# Patient Record
Sex: Male | Born: 1985 | Race: White | Hispanic: No | Marital: Single | Smoking: Current every day smoker
Health system: Southern US, Community
[De-identification: ages and names within clinical notes are randomized; demographics above are authoritative.]

## PROBLEM LIST (undated history)

## (undated) HISTORY — PX: HERNIA REPAIR: SHX51

---
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
## 2003-06-19 ENCOUNTER — Emergency Department (HOSPITAL_COMMUNITY): Admission: AD | Admit: 2003-06-19 | Discharge: 2003-06-19 | Payer: Self-pay | Admitting: Family Medicine

## 2003-08-27 ENCOUNTER — Emergency Department (HOSPITAL_COMMUNITY): Admission: AD | Admit: 2003-08-27 | Discharge: 2003-08-27 | Payer: Self-pay | Admitting: Family Medicine

## 2005-05-23 ENCOUNTER — Emergency Department (HOSPITAL_COMMUNITY): Admission: EM | Admit: 2005-05-23 | Discharge: 2005-05-23 | Payer: Self-pay | Admitting: Family Medicine

## 2007-01-02 ENCOUNTER — Emergency Department (HOSPITAL_COMMUNITY): Admission: EM | Admit: 2007-01-02 | Discharge: 2007-01-02 | Payer: Self-pay | Admitting: Emergency Medicine

## 2007-02-27 ENCOUNTER — Emergency Department (HOSPITAL_COMMUNITY): Admission: EM | Admit: 2007-02-27 | Discharge: 2007-02-27 | Payer: Self-pay | Admitting: Emergency Medicine

## 2007-11-13 ENCOUNTER — Emergency Department (HOSPITAL_COMMUNITY): Admission: EM | Admit: 2007-11-13 | Discharge: 2007-11-13 | Payer: Self-pay | Admitting: Emergency Medicine

## 2007-11-22 ENCOUNTER — Emergency Department (HOSPITAL_COMMUNITY): Admission: EM | Admit: 2007-11-22 | Discharge: 2007-11-22 | Payer: Self-pay | Admitting: Family Medicine

## 2011-06-15 ENCOUNTER — Emergency Department: Payer: Self-pay | Admitting: Emergency Medicine

## 2011-08-14 ENCOUNTER — Emergency Department (INDEPENDENT_AMBULATORY_CARE_PROVIDER_SITE_OTHER)
Admission: EM | Admit: 2011-08-14 | Discharge: 2011-08-14 | Disposition: A | Discharge status: Home / Self Care | Payer: Self-pay | Source: Home / Self Care | Attending: Family Medicine | Admitting: Family Medicine

## 2011-08-14 DIAGNOSIS — K047 Periapical abscess without sinus: Secondary | ICD-10-CM

## 2011-08-14 MED ORDER — AMOXICILLIN 500 MG PO CAPS: 500.0000 mg | ORAL_CAPSULE | Freq: Three times a day (TID) | ORAL | Status: AC

## 2011-08-14 MED ORDER — HYDROCODONE-ACETAMINOPHEN 5-325 MG PO TABS
2.0000 | ORAL_TABLET | Freq: Once | ORAL | Status: AC
  Administered 2011-08-14: 2 via ORAL

## 2011-08-14 MED ORDER — HYDROCODONE-ACETAMINOPHEN 5-325 MG PO TABS: ORAL_TABLET | ORAL | Status: DC

## 2011-08-14 MED ORDER — HYDROCODONE-ACETAMINOPHEN 5-325 MG PO TABS
ORAL_TABLET | ORAL | Status: AC
  Filled 2011-08-14: qty 2

## 2011-08-14 NOTE — ED Notes (Signed)
C/o dental pain and facial swelling for 2 days.  States the affected tooth is on the rt lower and has bothered him intermittently for 1 year but never this bad.

## 2011-08-14 NOTE — ED Provider Notes (Signed)
History     CSN: [id number] Arrival date & time: 08/14/2011  9:21 AM   First MD Initiated Contact with Patient 08/14/11 1032      Chief Complaint  Patient presents with  . Dental Pain    (Consider location/radiation/quality/duration/timing/severity/associated sxs/prior treatment) Patient is a 25 y.o. male presenting with tooth pain. The history is provided by the patient.  Dental PainThe primary symptoms include mouth pain. The symptoms began yesterday. The symptoms are worsening.  Additional symptoms include: gum tenderness, purulent gums, jaw pain and drooling. Associated symptoms comments: No fever.    History reviewed. No pertinent past medical history.  History reviewed. No pertinent past surgical history.  History reviewed. No pertinent family history.  History  Substance Use Topics  . Smoking status: Current Everyday Smoker -- 0.5 packs/day  . Smokeless tobacco: Not on file  . Alcohol Use: Yes      Review of Systems  Constitutional: Negative.   HENT: Positive for drooling.   Respiratory: Negative.   Cardiovascular: Negative.   Gastrointestinal: Negative.   Genitourinary: Negative.   Skin: Negative.     Allergies  Review of patient's allergies indicates no known allergies.  Home Medications   Current Outpatient Rx  Name Route Sig Dispense Refill  . AMOXICILLIN 500 MG PO CAPS Oral Take 1 capsule (500 mg total) by mouth 3 (three) times daily. 30 capsule 0  . HYDROCODONE-ACETAMINOPHEN 5-325 MG PO TABS  1-2 tabs q 6 hrs prn pain 15 tablet 0    BP 143/94  Pulse 54  Temp(Src) 98.7 F (37.1 C) (Oral)  Resp 16  SpO2 100%  Physical Exam  Nursing note and vitals reviewed. Constitutional: He appears well-developed and well-nourished.  HENT:       Right facial swelling. Over the mandible  Cardiovascular: Normal rate and regular rhythm.   Pulmonary/Chest: Effort normal and breath sounds normal.    ED Course  Procedures (including critical care  time)  Labs Reviewed - No data to display No results found.   1. Dental abscess       MDM          Randa Spike, MD 08/14/11 [phone]

## 2011-08-17 ENCOUNTER — Encounter (HOSPITAL_COMMUNITY): Payer: Self-pay | Admitting: Emergency Medicine

## 2011-08-17 ENCOUNTER — Emergency Department (HOSPITAL_COMMUNITY)
Admission: EM | Admit: 2011-08-17 | Discharge: 2011-08-17 | Disposition: A | Discharge status: Home / Self Care | Payer: Self-pay | Attending: Emergency Medicine | Admitting: Emergency Medicine

## 2011-08-17 DIAGNOSIS — R220 Localized swelling, mass and lump, head: Secondary | ICD-10-CM | POA: Insufficient documentation

## 2011-08-17 DIAGNOSIS — K0889 Other specified disorders of teeth and supporting structures: Secondary | ICD-10-CM

## 2011-08-17 DIAGNOSIS — K089 Disorder of teeth and supporting structures, unspecified: Secondary | ICD-10-CM | POA: Insufficient documentation

## 2011-08-17 DIAGNOSIS — K047 Periapical abscess without sinus: Secondary | ICD-10-CM | POA: Insufficient documentation

## 2011-08-17 DIAGNOSIS — R221 Localized swelling, mass and lump, neck: Secondary | ICD-10-CM | POA: Insufficient documentation

## 2011-08-17 DIAGNOSIS — K029 Dental caries, unspecified: Secondary | ICD-10-CM | POA: Insufficient documentation

## 2011-08-17 DIAGNOSIS — F17200 Nicotine dependence, unspecified, uncomplicated: Secondary | ICD-10-CM | POA: Insufficient documentation

## 2011-08-17 MED ORDER — IBUPROFEN 600 MG PO TABS: 600.0000 mg | ORAL_TABLET | Freq: Four times a day (QID) | ORAL | Status: AC | PRN

## 2011-08-17 MED ORDER — HYDROCODONE-ACETAMINOPHEN 5-325 MG PO TABS
2.0000 | ORAL_TABLET | Freq: Once | ORAL | Status: AC
  Administered 2011-08-17: 2 via ORAL
  Filled 2011-08-17: qty 2

## 2011-08-17 MED ORDER — HYDROCODONE-ACETAMINOPHEN 5-500 MG PO TABS: 1.0000 | ORAL_TABLET | Freq: Four times a day (QID) | ORAL | Status: AC | PRN

## 2011-08-17 NOTE — ED Notes (Signed)
Pt states he was seen at [facility] a couple days ago for toothache.  States he is taking antibiotic and is out of pain medication.  States he is unable to afford dentist appt and pain is worse.  States swelling has not improved.

## 2011-08-17 NOTE — ED Provider Notes (Signed)
History     CSN: [id number] Arrival date & time: 08/17/2011  3:57 PM   First MD Initiated Contact with Patient 08/17/11 1651      Chief Complaint  Patient presents with  . Dental Pain    (Consider location/radiation/quality/duration/timing/severity/associated sxs/prior treatment) Patient is a 25 y.o. male presenting with tooth pain. The history is provided by the patient.  Dental PainPrimary symptoms do not include fever, shortness of breath or sore throat.  pt c/o right lower dental pain for past week. Constant, dull, worse w eating. No radiation. No neck pain. No headache. No fever or chills. Has no local dentist. Is on abx but is out of pain med.   History reviewed. No pertinent past medical history.  History reviewed. No pertinent past surgical history.  No family history on file.  History  Substance Use Topics  . Smoking status: Current Everyday Smoker -- 0.5 packs/day  . Smokeless tobacco: Not on file  . Alcohol Use: Yes      Review of Systems  Constitutional: Negative for fever and chills.  HENT: Negative for sore throat.   Respiratory: Negative for shortness of breath.     Allergies  Review of patient's allergies indicates no known allergies.  Home Medications   Current Outpatient Rx  Name Route Sig Dispense Refill  . AMOXICILLIN 500 MG PO CAPS Oral Take 1 capsule (500 mg total) by mouth 3 (three) times daily. 30 capsule 0  . HYDROCODONE-ACETAMINOPHEN 5-325 MG PO TABS Oral Take 1-2 tablets by mouth every 6 (six) hours as needed. For pain       BP 125/76  Pulse 93  Temp(Src) 98.1 F (36.7 C) (Oral)  Resp 18  SpO2 98%  Physical Exam  Nursing note and vitals reviewed. Constitutional: He is oriented to person, place, and time. He appears well-developed and well-nourished. No distress.  HENT:  Head: Atraumatic.  Mouth/Throat: Oropharynx is clear and moist.       Multiple dental caries, absent teeth. Right lower dental decay, assoc gum swelling and  tenderness. No trismus. No swelling/tenderness to floor of mouth or neck.   Eyes: Pupils are equal, round, and reactive to light.  Neck: Neck supple. No tracheal deviation present.  Cardiovascular: Normal rate.   Pulmonary/Chest: Effort normal. No accessory muscle usage. No respiratory distress.  Musculoskeletal: Normal range of motion.  Lymphadenopathy:    He has no cervical adenopathy.  Neurological: He is alert and oriented to person, place, and time.  Skin: Skin is warm and dry.  Psychiatric: He has a normal mood and affect.    ED Course  Procedures (including critical care time)     MDM  Pt has ride, does not have to drive. vicodin po. Discussed need for close dental follow up.         Suzi Roots, MD 08/17/11 [phone]

## 2012-11-25 ENCOUNTER — Encounter (HOSPITAL_COMMUNITY): Payer: Self-pay | Admitting: *Deleted

## 2012-11-25 ENCOUNTER — Emergency Department (HOSPITAL_COMMUNITY)
Admission: EM | Admit: 2012-11-25 | Discharge: 2012-11-25 | Disposition: A | Discharge status: Home / Self Care | Payer: Self-pay | Attending: Emergency Medicine | Admitting: Emergency Medicine

## 2012-11-25 DIAGNOSIS — R1110 Vomiting, unspecified: Secondary | ICD-10-CM

## 2012-11-25 DIAGNOSIS — F17200 Nicotine dependence, unspecified, uncomplicated: Secondary | ICD-10-CM | POA: Insufficient documentation

## 2012-11-25 DIAGNOSIS — R112 Nausea with vomiting, unspecified: Secondary | ICD-10-CM | POA: Insufficient documentation

## 2012-11-25 DIAGNOSIS — R109 Unspecified abdominal pain: Secondary | ICD-10-CM | POA: Insufficient documentation

## 2012-11-25 DIAGNOSIS — Z9889 Other specified postprocedural states: Secondary | ICD-10-CM | POA: Insufficient documentation

## 2012-11-25 LAB — CBC WITH DIFFERENTIAL/PLATELET
Basophils Relative: 0 % (ref 0–1)
Eosinophils Absolute: 0 10*3/uL (ref 0.0–0.7)
Eosinophils Relative: 0 % (ref 0–5)
HCT: 47 % (ref 39.0–52.0)
Hemoglobin: 17.2 g/dL — ABNORMAL HIGH (ref 13.0–17.0)
Lymphs Abs: 2.4 10*3/uL (ref 0.7–4.0)
MCH: 33.3 pg (ref 26.0–34.0)
MCHC: 36.6 g/dL — ABNORMAL HIGH (ref 30.0–36.0)
MCV: 91.1 fL (ref 78.0–100.0)
Monocytes Absolute: 0.9 10*3/uL (ref 0.1–1.0)
Monocytes Relative: 5 % (ref 3–12)
Neutrophils Relative %: 80 % — ABNORMAL HIGH (ref 43–77)
RBC: 5.16 MIL/uL (ref 4.22–5.81)

## 2012-11-25 LAB — COMPREHENSIVE METABOLIC PANEL
Alkaline Phosphatase: 64 U/L (ref 39–117)
BUN: 14 mg/dL (ref 6–23)
Creatinine, Ser: 0.84 mg/dL (ref 0.50–1.35)
GFR calc Af Amer: >90 mL/min (ref >90)
Glucose, Bld: 98 mg/dL (ref 70–99)
Potassium: 3.7 mEq/L (ref 3.5–5.1)
Total Protein: 8 g/dL (ref 6.0–8.3)

## 2012-11-25 MED ORDER — ONDANSETRON HCL 4 MG/2ML IJ SOLN
4.0000 mg | Freq: Once | INTRAMUSCULAR | Status: AC
  Administered 2012-11-25: 4 mg via INTRAVENOUS
  Filled 2012-11-25: qty 2

## 2012-11-25 MED ORDER — ONDANSETRON 4 MG PO TBDP
8.0000 mg | ORAL_TABLET | Freq: Once | ORAL | Status: AC
  Administered 2012-11-25: 8 mg via ORAL
  Filled 2012-11-25: qty 2

## 2012-11-25 MED ORDER — GI COCKTAIL ~~LOC~~
30.0000 mL | Freq: Once | ORAL | Status: AC
  Administered 2012-11-25: 30 mL via ORAL
  Filled 2012-11-25: qty 30

## 2012-11-25 MED ORDER — ONDANSETRON HCL 4 MG PO TABS: 4.0000 mg | ORAL_TABLET | Freq: Three times a day (TID) | ORAL | Status: DC | PRN

## 2012-11-25 NOTE — ED Notes (Signed)
Pt with abd cramping, emesis, on and off chills since this am.

## 2012-11-25 NOTE — ED Notes (Signed)
Patient states he woke up this morning with nausea, vomiting and chills. Patient is afebrile. He states he cant even hold water down. No OTC's taken at home.

## 2012-11-25 NOTE — ED Provider Notes (Signed)
History     CSN: [id number]  Arrival date & time 11/25/12  1520   First MD Initiated Contact with Patient 11/25/12 1926      Chief Complaint  Patient presents with  . Abdominal Pain  . Emesis    (Consider location/radiation/quality/duration/timing/severity/associated sxs/prior treatment) Patient is a 27 y.o. male presenting with abdominal pain and vomiting.  Abdominal Pain Associated symptoms: vomiting   Emesis Associated symptoms: abdominal pain    Pt reports onset of vomiting and cramping abdominal pain earlier today. Multiple episode of vomiting, but improved since given ODT zofran in Triage. Pain is improved as well, but reports he is having indigestion now. Denies fever no diarrhea. No blood in vomit. No sick contact.   History reviewed. No pertinent past medical history.  Past Surgical History  Procedure Laterality Date  . Hernia repair      History reviewed. No pertinent family history.  History  Substance Use Topics  . Smoking status: Current Every Day Smoker -- 0.50 packs/day  . Smokeless tobacco: Not on file  . Alcohol Use: 16.8 oz/week    28 Cans of beer per week      Review of Systems  Gastrointestinal: Positive for vomiting and abdominal pain.   All other systems reviewed and are negative except as noted in HPI.   Allergies  Review of patient's allergies indicates no known allergies.  Home Medications  No current outpatient prescriptions on file.  BP 131/93  Pulse 96  Temp(Src) 98.5 F (36.9 C) (Oral)  SpO2 99%  Physical Exam  Nursing note and vitals reviewed. Constitutional: He is oriented to person, place, and time. He appears well-developed and well-nourished.  HENT:  Head: Normocephalic and atraumatic.  Eyes: EOM are normal. Pupils are equal, round, and reactive to light.  Neck: Normal range of motion. Neck supple.  Cardiovascular: Normal rate, normal heart sounds and intact distal pulses.   Pulmonary/Chest: Effort normal and breath  sounds normal.  Abdominal: Bowel sounds are normal. He exhibits no distension. There is no tenderness.  Musculoskeletal: Normal range of motion. He exhibits no edema and no tenderness.  Neurological: He is alert and oriented to person, place, and time. He has normal strength. No cranial nerve deficit or sensory deficit.  Skin: Skin is warm and dry. No rash noted.  Psychiatric: He has a normal mood and affect.    ED Course  Procedures (including critical care time)  Labs Reviewed  CBC WITH DIFFERENTIAL - Abnormal; Notable for the following:    WBC 16.7 (*)    Hemoglobin 17.2 (*)    MCHC 36.6 (*)    Neutrophils Relative 80 (*)    Neutro Abs 13.4 (*)    All other components within normal limits  COMPREHENSIVE METABOLIC PANEL   No results found.   1. Vomiting   DM  Labs unremarkable. Feeling better, tolerating PO fluids. Given GI cocktail for indigestion with improvement.         Charles B. Bernette Mayers, MD 11/25/12 2014

## 2013-04-14 ENCOUNTER — Encounter (HOSPITAL_COMMUNITY): Payer: Self-pay | Admitting: Emergency Medicine

## 2013-04-14 ENCOUNTER — Emergency Department (HOSPITAL_COMMUNITY)
Admission: EM | Admit: 2013-04-14 | Discharge: 2013-04-14 | Disposition: A | Discharge status: Home / Self Care | Payer: Self-pay | Attending: Emergency Medicine | Admitting: Emergency Medicine

## 2013-04-14 DIAGNOSIS — L0291 Cutaneous abscess, unspecified: Secondary | ICD-10-CM

## 2013-04-14 DIAGNOSIS — F17200 Nicotine dependence, unspecified, uncomplicated: Secondary | ICD-10-CM | POA: Insufficient documentation

## 2013-04-14 DIAGNOSIS — L0231 Cutaneous abscess of buttock: Secondary | ICD-10-CM | POA: Insufficient documentation

## 2013-04-14 MED ORDER — CEPHALEXIN 500 MG PO CAPS: 500.0000 mg | ORAL_CAPSULE | Freq: Four times a day (QID) | ORAL | Status: DC

## 2013-04-14 MED ORDER — SULFAMETHOXAZOLE-TRIMETHOPRIM 800-160 MG PO TABS: 2.0000 | ORAL_TABLET | Freq: Two times a day (BID) | ORAL | Status: DC

## 2013-04-14 NOTE — ED Notes (Signed)
Had a bump onhis butt and he popped it and now ir is bigger and redder x 4 days

## 2013-04-14 NOTE — ED Provider Notes (Signed)
  CSN: [id number]     Arrival date & time 04/14/13  1253 History     First MD Initiated Contact with Patient 04/14/13 1335     Chief Complaint  Patient presents with  . Recurrent Skin Infections   (Consider location/radiation/quality/duration/timing/severity/associated sxs/prior Treatment) HPI Comments: Patient presents with a chief complaint of an abscess to the buttock.  He reports that the abscess has been present for the past week.  He attempted to pop the area himself yesterday.  He was able to express a small amount of purulent fluid, but reports that he then developed erythema and increased pain surrounding the area after this.  He denies fever or chills.  No nausea or vomiting.  Denies prior history of DM or abscess in the past.  No history of HIV.    The history is provided by the patient.    History reviewed. No pertinent past medical history. Past Surgical History  Procedure Laterality Date  . Hernia repair     No family history on file. History  Substance Use Topics  . Smoking status: Current Every Day Smoker -- 0.50 packs/day  . Smokeless tobacco: Not on file  . Alcohol Use: 16.8 oz/week    28 Cans of beer per week    Review of Systems  Skin:       abscess    Allergies  Review of patient's allergies indicates no known allergies.  Home Medications  No current outpatient prescriptions on file. BP 118/67  Pulse 101  Temp(Src) 97.9 F (36.6 C) (Oral)  Resp 18  Ht 5\' 11"  (1.803 m)  Wt 170 lb (77.111 kg)  BMI 23.72 kg/m2  SpO2 97% Physical Exam  Nursing note and vitals reviewed. Constitutional: He appears well-developed and well-nourished.  HENT:  Head: Normocephalic and atraumatic.  Cardiovascular: Normal rate, regular rhythm and normal heart sounds.   Pulmonary/Chest: Effort normal and breath sounds normal.  Neurological: He is alert.  Skin: Skin is warm and dry.  Approximately 2-3 cm abscess of the right buttock with surrounding erythema, warmth, and  induration.  Psychiatric: He has a normal mood and affect.    ED Course   Procedures (including critical care time)  Labs Reviewed - No data to display No results found. No diagnosis found.  INCISION AND DRAINAGE Performed by: Anne Shutter, Ruthellen Tippy Consent: Verbal consent obtained. Risks and benefits: risks, benefits and alternatives were discussed Type: abscess  Body area: right buttock  Anesthesia: local infiltration  Incision was made with a scalpel.  Local anesthetic: lidocaine 2% with epinephrine  Anesthetic total: 6 ml  Complexity: complex Blunt dissection to break up loculations  Drainage: purulent  Drainage amount: small  Patient tolerance: Patient tolerated the procedure well with no immediate complications.     MDM  Patient with skin abscess.  Abscess incised and drained, small amount of purulent fluid expressed.   Abscess was not large enough to warrant packing or drain,  wound recheck in 2 days. Encouraged home warm soaks.  Some cellulitis of surrounding skin.    Will d/c to home with antibiotic.  Patient stable for discharge.  Return precautions given.  Pascal Lux Granjeno, PA-C 04/15/13 [phone]

## 2013-04-20 NOTE — ED Provider Notes (Signed)
Medical screening examination/treatment/procedure(s) were performed by non-physician practitioner and as supervising physician I was immediately available for consultation/collaboration.   Charles B. Sheldon, MD 04/20/13 1113 

## 2013-05-19 ENCOUNTER — Emergency Department (HOSPITAL_COMMUNITY)
Admission: EM | Admit: 2013-05-19 | Discharge: 2013-05-19 | Disposition: A | Discharge status: Home / Self Care | Payer: Self-pay | Attending: Emergency Medicine | Admitting: Emergency Medicine

## 2013-05-19 ENCOUNTER — Encounter (HOSPITAL_COMMUNITY): Payer: Self-pay | Admitting: *Deleted

## 2013-05-19 DIAGNOSIS — J029 Acute pharyngitis, unspecified: Secondary | ICD-10-CM | POA: Insufficient documentation

## 2013-05-19 DIAGNOSIS — R05 Cough: Secondary | ICD-10-CM | POA: Insufficient documentation

## 2013-05-19 DIAGNOSIS — J069 Acute upper respiratory infection, unspecified: Secondary | ICD-10-CM | POA: Insufficient documentation

## 2013-05-19 DIAGNOSIS — L408 Other psoriasis: Secondary | ICD-10-CM | POA: Insufficient documentation

## 2013-05-19 DIAGNOSIS — J3489 Other specified disorders of nose and nasal sinuses: Secondary | ICD-10-CM | POA: Insufficient documentation

## 2013-05-19 DIAGNOSIS — R059 Cough, unspecified: Secondary | ICD-10-CM | POA: Insufficient documentation

## 2013-05-19 DIAGNOSIS — R6883 Chills (without fever): Secondary | ICD-10-CM | POA: Insufficient documentation

## 2013-05-19 DIAGNOSIS — L409 Psoriasis, unspecified: Secondary | ICD-10-CM

## 2013-05-19 LAB — RAPID STREP SCREEN (MED CTR MEBANE ONLY): Streptococcus, Group A Screen (Direct): NEGATIVE

## 2013-05-19 NOTE — ED Provider Notes (Signed)
CSN: [id number]     Arrival date & time 05/19/13  7829 History   First MD Initiated Contact with Patient 05/19/13 0701     Chief Complaint  Patient presents with  . Rash   (Consider location/radiation/quality/duration/timing/severity/associated sxs/prior Treatment) Patient is a 27 y.o. male presenting with rash. The history is provided by the patient.  Rash Location:  Full body Quality: dryness, peeling, redness and scaling   Severity:  Severe Onset quality:  Gradual Duration: psoriasis for years but worse over the last 2 weeks. Timing:  Constant Progression:  Worsening Chronicity:  New Context: sick contacts   Context comment:  Neice recently with strep Relieved by:  Nothing Worsened by:  Nothing tried Ineffective treatments:  OTC analgesics and moisturizers Associated symptoms: sore throat and URI   Associated symptoms: no abdominal pain, no fever, no nausea, no shortness of breath, no throat swelling and no tongue swelling   Associated symptoms comment:  Cough Sore throat:    Severity:  Moderate   Onset quality:  Gradual   Duration:  2 days   Timing:  Constant   Progression:  Worsening   History reviewed. No pertinent past medical history. Past Surgical History  Procedure Laterality Date  . Hernia repair     No family history on file. History  Substance Use Topics  . Smoking status: Current Every Day Smoker -- 0.50 packs/day  . Smokeless tobacco: Not on file  . Alcohol Use: 16.8 oz/week    28 Cans of beer per week    Review of Systems  Constitutional: Positive for chills. Negative for fever.  HENT: Positive for congestion and sore throat. Negative for drooling, mouth sores and sinus pressure.   Respiratory: Positive for cough. Negative for shortness of breath.   Gastrointestinal: Negative for nausea and abdominal pain.  Skin: Positive for rash.  All other systems reviewed and are negative.    Allergies  Review of patient's allergies indicates no known  allergies.  Home Medications   Current Outpatient Rx  Name  Route  Sig  Dispense  Refill  . cephALEXin (KEFLEX) 500 MG capsule   Oral   Take 1 capsule (500 mg total) by mouth 4 (four) times daily.   40 capsule   0   . sulfamethoxazole-trimethoprim (SEPTRA DS) 800-160 MG per tablet   Oral   Take 2 tablets by mouth 2 (two) times daily.   40 tablet   0    BP 132/91  Pulse 95  Temp(Src) 98.4 F (36.9 C) (Oral)  Resp 16  Ht 5\' 11"  (1.803 m)  SpO2 99% Physical Exam  Nursing note and vitals reviewed. Constitutional: He is oriented to person, place, and time. He appears well-developed and well-nourished. No distress.  HENT:  Head: Normocephalic and atraumatic.  Mouth/Throat: Mucous membranes are normal. Posterior oropharyngeal erythema present. No tonsillar abscesses.  Eyes: Conjunctivae and EOM are normal. Pupils are equal, round, and reactive to light.  Neck: Normal range of motion. Neck supple.  Cardiovascular: Normal rate, regular rhythm and intact distal pulses.   No murmur heard. Pulmonary/Chest: Effort normal and breath sounds normal. No respiratory distress. He has no wheezes. He has no rales.  Abdominal: Soft. He exhibits no distension. There is no tenderness. There is no rebound and no guarding.  Musculoskeletal: Normal range of motion. He exhibits no edema and no tenderness.  Lymphadenopathy:    He has no cervical adenopathy.  Neurological: He is alert and oriented to person, place, and time.  Skin: Skin  is warm and dry. No rash noted. No erythema.  Psychiatric: He has a normal mood and affect. His behavior is normal.    ED Course  Procedures (including critical care time) Labs Review Labs Reviewed  RAPID STREP SCREEN   Imaging Review No results found.  MDM  No diagnosis found.  Patient presenting here with concern for strep throat. He had exposure and has had sore throats and fever and also cough. Erythema in the pharynx but no tonsillar exudate. Also  patient has a history of psoriasis which is been ongoing since age 61 but it has worsened over the last 2 weeks which she states in the past when he had strep throat a similar thing happened. No signs of scarlet fever at this time. Patient is nontoxic appearing in no sign of PTA, RPA or concern for epiglottitis.  Feel most likely viral URI vs strep.  7:46 AM Rapid strep neg.  Feel most likely URI.  Will do supportive care.  Gwyneth Sprout, MD 05/19/13 [phone]

## 2013-05-19 NOTE — ED Notes (Signed)
Dr. Plunkett at bedside.  

## 2013-05-19 NOTE — ED Notes (Signed)
The pt was exposed to strep throat 14 days ago and he has  A rash that has been worse for the past week

## 2013-05-21 LAB — CULTURE, GROUP A STREP

## 2013-06-22 ENCOUNTER — Encounter (HOSPITAL_COMMUNITY): Payer: Self-pay | Admitting: Emergency Medicine

## 2013-06-22 ENCOUNTER — Emergency Department (HOSPITAL_COMMUNITY)
Admission: EM | Admit: 2013-06-22 | Discharge: 2013-06-22 | Disposition: A | Discharge status: Home / Self Care | Payer: Self-pay | Attending: Emergency Medicine | Admitting: Emergency Medicine

## 2013-06-22 DIAGNOSIS — F17200 Nicotine dependence, unspecified, uncomplicated: Secondary | ICD-10-CM | POA: Insufficient documentation

## 2013-06-22 DIAGNOSIS — L84 Corns and callosities: Secondary | ICD-10-CM | POA: Insufficient documentation

## 2013-06-22 NOTE — ED Provider Notes (Signed)
CSN: [id number]     Arrival date & time 06/22/13  1478 History   First MD Initiated Contact with Patient 06/22/13 [phone]     Chief Complaint  Patient presents with  . Foot Pain    R foot   (Consider location/radiation/quality/duration/timing/severity/associated sxs/prior Treatment) HPI.... pain on the bottom of right foot for several weeks. Patient is  scraping off a callus as needed. No fever, chills, redness. Pain increases with ambulation. Severity is mild to moderate.  History reviewed. No pertinent past medical history. Past Surgical History  Procedure Laterality Date  . Hernia repair     No family history on file. History  Substance Use Topics  . Smoking status: Current Every Day Smoker -- 0.50 packs/day    Types: Cigarettes  . Smokeless tobacco: Not on file  . Alcohol Use: 16.8 oz/week    28 Cans of beer per week    Review of Systems  All other systems reviewed and are negative.    Allergies  Review of patient's allergies indicates no known allergies.  Home Medications   Current Outpatient Rx  Name  Route  Sig  Dispense  Refill  . Ibuprofen-Diphenhydramine HCl (ADVIL PM) 200-25 MG CAPS   Oral   Take 2 tablets by mouth at bedtime as needed (sleep).          BP 120/76  Pulse 69  Temp(Src) 98.7 F (37.1 C) (Oral)  Resp 18  Ht 5\' 11"  (1.803 m)  Wt 165 lb (74.844 kg)  BMI 23.02 kg/m2  SpO2 98% Physical Exam  Constitutional: He is oriented to person, place, and time. He appears well-developed and well-nourished.  HENT:  Head: Normocephalic and atraumatic.  Musculoskeletal:  Right foot: Callus/corn on plantar aspect at the MTP joint of the second third and fourth digit  Neurological: He is alert and oriented to person, place, and time.  Skin: Skin is warm and dry.  Psychiatric: He has a normal mood and affect.    ED Course  Procedures (including critical care time) Labs Review Labs Reviewed - No data to display Imaging Review No results  found.  EKG Interpretation   None       MDM   1. Corn of foot    Patient will require minor surgery. I attempted to get him appointment with the local podiatry group. Phone number given for podiatrist  [phone]    Donnetta Hutching, MD 06/22/13 1048

## 2013-06-22 NOTE — ED Notes (Signed)
Patient states that he has had a problem with the bottom of his right foot.   Patient states "it used to be a hard place on the bottom that I could peel off".  "I got this egg thing but it made it spread.   If I can get it to the root, it is better".

## 2013-09-14 ENCOUNTER — Encounter (HOSPITAL_COMMUNITY): Payer: Self-pay | Admitting: Emergency Medicine

## 2013-09-14 ENCOUNTER — Emergency Department (HOSPITAL_COMMUNITY)
Admission: EM | Admit: 2013-09-14 | Discharge: 2013-09-14 | Disposition: A | Discharge status: Home / Self Care | Payer: Self-pay | Attending: Emergency Medicine | Admitting: Emergency Medicine

## 2013-09-14 DIAGNOSIS — M25559 Pain in unspecified hip: Secondary | ICD-10-CM | POA: Insufficient documentation

## 2013-09-14 DIAGNOSIS — M549 Dorsalgia, unspecified: Secondary | ICD-10-CM | POA: Insufficient documentation

## 2013-09-14 DIAGNOSIS — H9203 Otalgia, bilateral: Secondary | ICD-10-CM

## 2013-09-14 DIAGNOSIS — M25551 Pain in right hip: Secondary | ICD-10-CM

## 2013-09-14 DIAGNOSIS — G8929 Other chronic pain: Secondary | ICD-10-CM | POA: Insufficient documentation

## 2013-09-14 DIAGNOSIS — F17200 Nicotine dependence, unspecified, uncomplicated: Secondary | ICD-10-CM | POA: Insufficient documentation

## 2013-09-14 DIAGNOSIS — H9209 Otalgia, unspecified ear: Secondary | ICD-10-CM | POA: Insufficient documentation

## 2013-09-14 MED ORDER — NAPROXEN SODIUM 220 MG PO TABS: 220.0000 mg | ORAL_TABLET | Freq: Two times a day (BID) | ORAL | Status: DC

## 2013-09-14 MED ORDER — ACETAMINOPHEN 500 MG PO TABS: 1000.0000 mg | ORAL_TABLET | Freq: Four times a day (QID) | ORAL | Status: DC | PRN

## 2013-09-14 NOTE — Discharge Instructions (Signed)
°Emergency Department Resource Guide °1) Find a Doctor and Pay Out of Pocket °Although you won't have to find out who is covered by your insurance plan, it is a good idea to ask around and get recommendations. You will then need to call the office and see if the doctor you have chosen will accept you as a new patient and what types of options they offer for patients who are self-pay. Some doctors offer discounts or will set up payment plans for their patients who do not have insurance, but you will need to ask so you aren't surprised when you get to your appointment. ° °2) Contact Your Local Health Department °Not all health departments have doctors that can see patients for sick visits, but many do, so it is worth a call to see if yours does. If you don't know where your local health department is, you can check in your phone book. The CDC also has a tool to help you locate your state's health department, and many state websites also have listings of all of their local health departments. ° °3) Find a Walk-in Clinic °If your illness is not likely to be very severe or complicated, you may want to try a walk in clinic. These are popping up all over the country in pharmacies, drugstores, and shopping centers. They're usually staffed by nurse practitioners or physician assistants that have been trained to treat common illnesses and complaints. They're usually fairly quick and inexpensive. However, if you have serious medical issues or chronic medical problems, these are probably not your best option. ° °No Primary Care Doctor: °- Call [facility] at  832-8000 - they can help you locate a primary care doctor that  accepts your insurance, provides certain services, etc. °- Physician Referral Service- 1-800-533-3463 ° °Chronic Pain Problems: °Organization         Address  Phone   Notes  °[facility]  (336) 297-2271 Patients need to be referred by their primary care doctor.  ° °Medication  Assistance: °Organization         Address  Phone   Notes  °[place] [facility] [address] °[place], [place] [address] (336) 641-8030 --Must be a resident of [place] °-- Must have NO insurance coverage whatsoever (no Medicaid/ Medicare, etc.) °-- The pt. MUST have a primary care doctor that directs their care regularly and follows them in the community °  °[facility]  (866) 331-1348   °[employer]  (888) 892-1162   ° °Agencies that provide inexpensive medical care: °Organization         Address  Phone   Notes  °[facility]  (336) 832-8035   °[facility]    (336) 832-7272   °[facility] [address] °[place], [place] [address] (336) 832-4777   °[facility] [address], °[place] (336) 271-4999   °[facility]    (336) 373-0678   °[facility]    (336) 272-1050   °[facility] ° [address], [place] Phone:  (336) 832-4444, Fax:  (336) 832-4440 Hours of Operation:  9 am - 6 pm, M-F.  Also accepts Medicaid/Medicare and self-pay.  °[facility] ° [address], [place] Phone: (336) 832-3150, Fax: (336) 832-3151. Hours of Operation:  8:30 am - 5:30 pm, M-F.  Also accepts Medicaid and self-pay.  °[facility] [place] [address], [place] Phone: (336) 878-6027   °[facility] [address], [place], [place] (336)723-1848, Ext. 123 Mondays & Thursdays: 7-9 AM.  First 15 patients are seen on a first come, first serve basis. °  ° °Medicaid-accepting [place] Providers: ° °Organization         Address  Phone   Notes  °[facility] [address], [place] (336) 641-2100 Also accepts self-pay patients.  °[facility] [address], [place] ° (336) 856-9996   °[facility] [address], [place]  (336) 288-8857   °[facility] [address] [place] [address], [place] (336) 299-7000   °Veita Bland [address], [place]  ° (336) 373-1557 Only accepts [place] Access Medicaid patients after they have their name applied to their card.  ° °Self-Pay (no insurance) in [place]: ° °Organization         Address  Phone   Notes  °Sickle Cell Patients, [facility] [address], [place] (336) 832-1970   °[facility] [address], [place] (336) 832-4400   °[facility] ° [address], [place] (336) 992-4800   °[facility]/Dr. Osei-Bonsu ° [address] [place] [address], [place] or [address], [place] (336) 841-8500 Phone number for both [place] and [place] locations is the same.  °[facility] [address], [place] (336) 299-0000   °[facility] [address] [place] [address], [place] or [address] (336) 852-7530 °(336) 878-2260   °[facility] [address], [place] (336) 350-1642, phone; (336) 294-5005, fax Sees patients 1st and 3rd Saturday of every month.  Must not qualify for public or private insurance (i.e. Medicaid, Medicare, [place] Health Choice, Veterans' Benefits) • Household income should be no more than 200% of the poverty level •The clinic cannot treat you if you are pregnant or think you are pregnant • Sexually transmitted diseases are not treated at the clinic.  ° ° °Dental Care: °Organization         Address  Phone  Notes  °[place] [facility] [address], [place] (336) 641-6152 Accepts children up to age 21 who are enrolled in Medicaid or [place] Health Choice; pregnant women with a Medicaid card; and children who have applied for Medicaid or [place] Health Choice, but were declined, whose parents can pay a reduced fee at time of service.  °[place]  [facility] [place]  [address], [place] (336) 641-7733 Accepts children up to age 21 who are enrolled in Medicaid or [place] Health Choice; pregnant women with a Medicaid card; and children who have applied for Medicaid or [place] Health Choice, but were declined, whose parents can pay a reduced fee at time of service.  °[facility] ° [address], [place] (336) 641-4533 Patients are seen by appointment only. Walk-ins are not accepted. [facility] will see patients 18 years of age and older. °Monday - Tuesday (8am-5pm) °Most Wednesdays (8:30-5pm) °$30 per visit, cash only  °[facility] ° [address], [place] (336) 641-4533 Patients are seen by appointment only. Walk-ins are not accepted. [facility] will see patients 18 years of age and older. °One   Wednesday Evening (Monthly: Volunteer Based).  $30 per visit, cash only  °[facility]  (919) 537-3737 for adults; Children under age 4, call Graduate Pediatric Dentistry at (919) [phone]. Children aged 4-14, please call (919) [phone] to request a pediatric application. ° Dental services are provided in all areas of dental care including fillings, crowns and bridges, complete and partial dentures, implants, gum treatment, root canals, and extractions. Preventive care is also provided. Treatment is provided to both adults and children. °Patients are selected via a lottery and there is often a waiting list. °  °[facility] [address], °[place] ° (336) 763-8833 [email or web address] °  °[facility] [address], [place], [place] (336)723-1848, Ext. 123 Second and Fourth Thursday of each month, opens at 6:30 AM; Clinic ends at 9 AM.  Patients are seen on a first-come first-served basis, and a limited number are seen during each clinic.  ° °[facility] ° [address], [place], [place] (336) 723-7904    Eligibility Requirements °You must have lived in [place], [place], or [place] counties for at least the last three months. °  You cannot be eligible for state or federal sponsored healthcare insurance, including Veterans Administration, Medicaid, or Medicare. °  You generally cannot be eligible for healthcare insurance through your employer.  °  How to apply: °Eligibility screenings are held every Tuesday and Wednesday afternoon from 1:00 pm until 4:00 pm. You do not need an appointment for the interview!  °[facility] [address], [place], [place] 336-631-2330   °[facility]  336-342-8273   °[facility]  336-703-3100   °[facility]  336-570-6415   ° °Behavioral Health Resources in the Community: °Intensive Outpatient Programs °Organization         Address  Phone  Notes  °[facility] [address], [place], [place] 336-878-6098   °[facility] [address], [place], [place] 336-832-9800   °[facility] [address], [place], [place] ° 336-882-2125   °[facility] [address],  °[place], [place] 1-800-853-5163 or 336-641-4981   °Substance Abuse Resources °Organization         Address  Phone  Notes  °[facility]  336-882-2125   °[facility]  336-784-9470   °[facility]  336-285-9073   °[facility]  336-845-3988   °Residential & Outpatient Substance Abuse Program  1-800-659-3381   °Psychological Services °Organization         Address  Phone  Notes  °[facility]  336- 832-9600   °[facility]  336- 378-7881   °[facility] [address], [place] 1-800-853-5163 or 336-641-4981   ° °Mobile Crisis Teams °Organization         Address  Phone  Notes  °[facility], Mobile Crisis Care Unit  1-877-626-1772   °Assertive °[facility] ° [address]  [place], [place] 336-834-9664   °Sharon DeEsch [address] °[place] 336-554-5454   ° °Self-Help/Support Groups °Organization         Address  Phone             Notes  °[facility] - variety of support groups  336- 373-1402 Call for more information  °Narcotics Anonymous (NA), [facility] [address], °[place]  2 meetings at this location  ° °  Residential Treatment Programs °Organization         Address  Phone  Notes  °[facility] [address],    °[place]  1-866-801-8205   °[facility] ° [address], [place], [place] 704-293-8524   °[facility] [address], [place] 336-845-3988 Admissions: 8am-3pm M-F  °[facility] [address]    °[place], [place] 336-841-1104   °[facility] [address], [place], [place] 336-379-7146   °[facility] [address]  °[place], [place] 336-285-9073   °[facility] Intensive Outpatient [address], [place], [place] 336-852-3033   °[facility] [address]  °[place], [place] 1-877-615-2722 or 336-784-9470   °[facility] [address] [place], [place] 336-227-7417 Accepts Medicaid  °[facility] [address]  ° [place] 1-800-659-3381 Substance Abuse/Addiction Treatment  ° °[place] Behavioral Health Resources °Organization         Address  Phone  Notes  °[facility]  (888) 581-9988   °Julie Brannon, PhD [address] [place], [place]   (336) 349-5553 or (336) 951-0000   °[facility]   [address] °[place], [place] (336) 349-4454   °[facility] [address], [place], [place] (336) 342-8316 Insurance/Medicaid/sponsorship through [facility]  °[facility] [address]                                    [place], [place] (336) 342-8316 Therapy/tele-psych/case    °[facility] [address]  ° [place],  (336) 349-2233    °Dr. Arfeen  (336) 349-4544   °[facility] [place]  [employer] [place] [facility] 1) [address], [place] °2) [address], [place] °3)  [address], [place] (336) 349-3220 °(336) 342-7768 ° °(336) 342-8140   °[place] [facility] (336) 342-1394 or (336) 342-3537 (After Hours)    ° ° °

## 2013-09-14 NOTE — ED Provider Notes (Signed)
CSN: [id number]     Arrival date & time 09/14/13  0736 History   First MD Initiated Contact with Patient 09/14/13 [phone]     Chief Complaint  Patient presents with  . Back Pain  . Hip Pain   (Consider location/radiation/quality/duration/timing/severity/associated sxs/prior Treatment) HPI Comments: 28 year old male complaining of right posterior hip pain for the past 4 months. Started when he started working at his current job. Initially improved with NSAIDs, but now pain seems to persist through this. No fevers. Pain seems to improve after moving around for a while. Every now and again, he will feel a catch which will give him significant pain briefly.  He also complains of stuffiness in his ears which has been present for "as long as I can remember." Denies acute nasal congestion, but complains that he has always had poor nasal passages.   Patient is a 28 y.o. male presenting with hip pain.  Hip Pain This is a chronic problem. Episode onset: 4 months ago. Episode frequency: Intermittent. The problem has been gradually worsening. Pertinent negatives include no chest pain, no abdominal pain and no shortness of breath. Exacerbated by: Lifting at work. Relieved by: Initially improved with"Bayer back and body". This no longer seems to help.    History reviewed. No pertinent past medical history. Past Surgical History  Procedure Laterality Date  . Hernia repair     History reviewed. No pertinent family history. History  Substance Use Topics  . Smoking status: Current Every Day Smoker -- 0.50 packs/day    Types: Cigarettes  . Smokeless tobacco: Not on file  . Alcohol Use: 16.8 oz/week    28 Cans of beer per week    Review of Systems  Constitutional: Negative for fever.  HENT: Negative for congestion.   Respiratory: Negative for cough and shortness of breath.   Cardiovascular: Negative for chest pain.  Gastrointestinal: Negative for nausea, vomiting, abdominal pain and diarrhea.   Musculoskeletal: Positive for back pain.  All other systems reviewed and are negative.    Allergies  Review of patient's allergies indicates no known allergies.  Home Medications  No current outpatient prescriptions on file. BP 127/79  Pulse 94  Temp(Src) 98.8 F (37.1 C) (Oral)  Resp 16  SpO2 100% Physical Exam  Nursing note and vitals reviewed. Constitutional: He is oriented to person, place, and time. He appears well-developed and well-nourished. No distress.  HENT:  Head: Normocephalic and atraumatic.  Eyes: Conjunctivae are normal. No scleral icterus.  Neck: Neck supple.  Cardiovascular: Normal rate and intact distal pulses.   Pulmonary/Chest: Effort normal. No stridor. No respiratory distress.  Abdominal: Normal appearance. He exhibits no distension.  Musculoskeletal:       Right hip: He exhibits tenderness (mild). He exhibits normal range of motion (Distal strength, sensation, pulses intact), normal strength, no swelling, no crepitus and no deformity.       Lumbar back: He exhibits no tenderness and no bony tenderness.  Initial gait is limping, but this improves after several steps  Neurological: He is alert and oriented to person, place, and time.  Skin: Skin is warm and dry. No rash noted.  Psychiatric: He has a normal mood and affect. His behavior is normal.    ED Course  Procedures (including critical care time) Labs Review Labs Reviewed - No data to display Imaging Review No results found.  EKG Interpretation   None       MDM   1. Right hip pain   2. Ear pain,  bilateral    28 year old male with apparent musculoskeletal left hip pain.  Denies recent injuries. No fevers, swelling, redness, or decreased range of motion to suggest septic joint. Have recommended supportive care with scheduled NSAIDs and when necessary Tylenol. He also complains of chronic ear and sinus problems, sore provide contact information for ENT.    Candyce Churn,  MD 09/14/13 [phone]

## 2013-09-14 NOTE — ED Notes (Addendum)
Pt reports right hip pain related to work for the last 4 months.  Pt reports constant muffled hearing and pain when his ears need to pop, states this has been going on for "years". Pt ambulatory, moves all extremities well. No deformities, noted. Pt awake, alert, NAD at present.

## 2013-12-20 ENCOUNTER — Encounter (HOSPITAL_COMMUNITY): Payer: Self-pay | Admitting: Emergency Medicine

## 2013-12-20 ENCOUNTER — Emergency Department (HOSPITAL_COMMUNITY)
Admission: EM | Admit: 2013-12-20 | Discharge: 2013-12-20 | Disposition: A | Discharge status: Home / Self Care | Payer: Self-pay | Attending: Emergency Medicine | Admitting: Emergency Medicine

## 2013-12-20 DIAGNOSIS — R109 Unspecified abdominal pain: Secondary | ICD-10-CM | POA: Insufficient documentation

## 2013-12-20 DIAGNOSIS — F17200 Nicotine dependence, unspecified, uncomplicated: Secondary | ICD-10-CM | POA: Insufficient documentation

## 2013-12-20 DIAGNOSIS — R110 Nausea: Secondary | ICD-10-CM | POA: Insufficient documentation

## 2013-12-20 DIAGNOSIS — Z791 Long term (current) use of non-steroidal anti-inflammatories (NSAID): Secondary | ICD-10-CM | POA: Insufficient documentation

## 2013-12-20 NOTE — ED Provider Notes (Signed)
CSN: [id number]     Arrival date & time 12/20/13  0715 History   First MD Initiated Contact with Patient 12/20/13 [phone]     Chief Complaint  Patient presents with  . Abdominal Pain  . Nausea     (Consider location/radiation/quality/duration/timing/severity/associated sxs/prior Treatment) HPI 28 year old man with no significant medical history who presents with abdominal pain.   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  He felt somewhat nauseated but did not vomit, nausea now resolved. He tried to have a bowel movement thinking this would relieve his symptoms but could not.  Last BM yesterday, normal (brown, no blood).  He went to work but told his boss he wasn't feeling well so boss instructed him to go to the doctor before returning to work.   No unusual foods, no fever, no sick contacts.    History reviewed. No pertinent past medical history. Past Surgical History  Procedure Laterality Date  . Hernia repair     No family history on file. History  Substance Use Topics  . Smoking status: Current Every Day Smoker -- 0.50 packs/day    Types: Cigarettes  . Smokeless tobacco: Not on file  . Alcohol Use: 16.8 oz/week    28 Cans of beer per week     Comment: occasionally    Review of Systems  Constitutional: Negative for fever, diaphoresis and fatigue.  Respiratory: Negative for chest tightness and shortness of breath.   Cardiovascular: Negative for chest pain and palpitations.  Gastrointestinal: Positive for nausea and abdominal pain. Negative for vomiting, diarrhea, constipation, blood in stool and abdominal distention.  Genitourinary: Negative for dysuria.  Musculoskeletal: Negative for myalgias.  Neurological: Negative for dizziness, weakness and headaches.     Allergies  Review of patient's allergies indicates no known allergies.  Home Medications   Prior to Admission medications   Medication Sig Start Date End  Date Taking? Authorizing Provider  acetaminophen (TYLENOL) 500 MG tablet Take 2 tablets (1,000 mg total) by mouth every 6 (six) hours as needed for moderate pain. 09/14/13   Candyce ChurnJohn David Wofford III, MD  naproxen sodium (ALEVE) 220 MG tablet Take 1 tablet (220 mg total) by mouth 2 (two) times daily with a meal. 09/14/13   Candyce ChurnJohn David Wofford III, MD   BP 118/79  Pulse 74  Temp(Src) 97.9 F (36.6 C) (Oral)  Resp 20  Ht 5\' 11"  (1.803 m)  Wt 145 lb (65.772 kg)  BMI 20.23 kg/m2  SpO2 97% Physical Exam  Constitutional: He is oriented to person, place, and time. He appears well-developed and well-nourished. No distress.  HENT:  Head: Normocephalic and atraumatic.  Eyes: Pupils are equal, round, and reactive to light.  Neck: Normal range of motion. Neck supple.  Cardiovascular: Normal rate, regular rhythm and normal heart sounds.   Pulmonary/Chest: Effort normal and breath sounds normal.  Abdominal: Soft. Bowel sounds are normal. He exhibits no distension. There is no tenderness. There is no rebound and no guarding.  Musculoskeletal: Normal range of motion.  Neurological: He is alert and oriented to person, place, and time.  Skin: Skin is warm and dry.    ED Course  Procedures (including critical care time) Labs Review Labs Reviewed - No data to display  Imaging Review No results found.   EKG Interpretation None      MDM   Final diagnoses:  Abdominal pain  Patient presented with crampy abdominal pain, likely due  to gas, now nearly resolved.    Discharged home with instructions to return to ED if symptoms worsen or he develops fever or dizziness.   Rocco SereneMorgan Quandre Polinski, MD 12/20/13 [phone]

## 2013-12-20 NOTE — Discharge Instructions (Signed)
We are glad you are feeling better!  Drink Gatorade and water today to be sure you do not become dehydrated.  You should return to the ER if your symptoms worsen, you develop fever or dizziness, etc.   You may return to work tomorrow (4/15) with no restrictions.

## 2013-12-20 NOTE — ED Notes (Signed)
Pt states he woke up this morning with abd pain, nausea and dry heaving. States he feels his stomach bubbling like he needs to use the bathroom but hasn't been able to. Last BM was yesterday. States the pain is a cramping sensation

## 2013-12-21 NOTE — ED Provider Notes (Signed)
28 y.o. Male with crampy abdominal pain this am now resolved.  PE VSS Abdomen- soft and nontender  Patient given return precautions and need for follow up.     I performed a history and physical examination of Donald Tate and discussed his management with Dr. Aundria Rudogers.  I agree with the history, physical, assessment, and plan of care, with the following exceptions: None  I was present for the following procedures: None Time Spent in Critical Care of the patient: None Time spent in discussions with the patient and family: 617  Donald Tate S Donald Tate    Donald Quarryanielle S Donald Thoennes, MD 12/21/13 [phone]

## 2014-01-31 ENCOUNTER — Encounter (HOSPITAL_COMMUNITY): Payer: Self-pay | Admitting: Emergency Medicine

## 2014-01-31 ENCOUNTER — Emergency Department (HOSPITAL_COMMUNITY)
Admission: EM | Admit: 2014-01-31 | Discharge: 2014-01-31 | Disposition: A | Discharge status: Home / Self Care | Payer: Self-pay | Attending: Emergency Medicine | Admitting: Emergency Medicine

## 2014-01-31 DIAGNOSIS — F17200 Nicotine dependence, unspecified, uncomplicated: Secondary | ICD-10-CM | POA: Insufficient documentation

## 2014-01-31 DIAGNOSIS — R55 Syncope and collapse: Secondary | ICD-10-CM | POA: Insufficient documentation

## 2014-01-31 DIAGNOSIS — R110 Nausea: Secondary | ICD-10-CM | POA: Insufficient documentation

## 2014-01-31 LAB — CBG MONITORING, ED: Glucose-Capillary: 110 mg/dL — ABNORMAL HIGH (ref 70–99)

## 2014-01-31 NOTE — ED Notes (Signed)
Pt. Felt better after eating crackers and having juice.  Pt. Denies any dizziness or nausea

## 2014-01-31 NOTE — Discharge Instructions (Signed)
Near-Syncope °Near-syncope (commonly known as near fainting) is sudden weakness, dizziness, or feeling like you might pass out. During an episode of near-syncope, you may also develop pale skin, have tunnel vision, or feel sick to your stomach (nauseous). Near-syncope may occur when getting up after sitting or while standing for a long time. It is caused by a sudden decrease in blood flow to the brain. This decrease can result from various causes or triggers, most of which are not serious. However, because near-syncope can sometimes be a sign of something serious, a medical evaluation is required. The specific cause is often not determined. °HOME CARE INSTRUCTIONS  °Monitor your condition for any changes. The following actions may help to alleviate any discomfort you are experiencing: °· Have someone stay with you until you feel stable. °· Lie down right away if you start feeling like you might faint. Breathe deeply and steadily. Wait until all the symptoms have passed. Most of these episodes last only a few minutes. You may feel tired for several hours.   °· Drink enough fluids to keep your urine clear or pale yellow.   °· If you are taking blood pressure or heart medicine, get up slowly when seated or lying down. Take several minutes to sit and then stand. This can reduce dizziness. °· Follow up with your health care provider as directed.  °SEEK IMMEDIATE MEDICAL CARE IF:  °· You have a severe headache.   °· You have unusual pain in the chest, abdomen, or back.   °· You are bleeding from the mouth or rectum, or you have black or tarry stool.   °· You have an irregular or very fast heartbeat.   °· You have repeated fainting or have seizure-like jerking during an episode.   °· You faint when sitting or lying down.   °· You have confusion.   °· You have difficulty walking.   °· You have severe weakness.   °· You have vision problems.   °MAKE SURE YOU:  °· Understand these instructions. °· Will watch your  condition. °· Will get help right away if you are not doing well or get worse. °Document Released: 08/25/2005 Document Revised: 04/27/2013 Document Reviewed: 01/28/2013 °ExitCare® Patient Information ©2014 [employer]. ° °

## 2014-01-31 NOTE — ED Provider Notes (Signed)
CSN: [id number]     Arrival date & time 01/31/14  0654 History   First MD Initiated Contact with Patient 01/31/14 0703     Chief Complaint  Patient presents with  . Dizziness  . Nausea      Patient is a 28 y.o. male presenting with dizziness. The history is provided by the patient.  Dizziness Quality:  Lightheadedness Severity:  Mild Onset quality:  Gradual Timing:  Intermittent Progression:  Improving Chronicity:  New Context: standing up   Relieved by: rest. Worsened by:  Standing up Associated symptoms: nausea   Associated symptoms: no blood in stool, no chest pain, no diarrhea, no headaches, no shortness of breath, no syncope, no vomiting and no weakness   Risk factors: no hx of stroke and no new medications   pt reports after waking up he felt "dizzy" and felt as though he might pass out No HA/CP/SOB He did not eat breakfast No recent heavy ETOH/drug abuse No recent trauma/neck/head injury. He has no h/o cardiac disease    PMH - none Past Surgical History  Procedure Laterality Date  . Hernia repair     No family history on file. History  Substance Use Topics  . Smoking status: Current Every Day Smoker -- 0.50 packs/day    Types: Cigarettes  . Smokeless tobacco: Not on file  . Alcohol Use: 16.8 oz/week    28 Cans of beer per week     Comment: occasionally    Review of Systems  Constitutional: Negative for fever.  Eyes:       Tunnel vision briefly, now resolved No visual loss reported   Respiratory: Negative for shortness of breath.   Cardiovascular: Negative for chest pain and syncope.  Gastrointestinal: Positive for nausea. Negative for vomiting, diarrhea and blood in stool.  Neurological: Positive for dizziness and light-headedness. Negative for syncope and headaches.  All other systems reviewed and are negative.     Allergies  Review of patient's allergies indicates no known allergies.  Home Medications   Prior to Admission medications    Medication Sig Start Date End Date Taking? Authorizing Provider  diphenhydramine-acetaminophen (TYLENOL PM) 25-500 MG TABS Take 2 tablets by mouth at bedtime as needed.   Yes Historical Provider, MD   BP 129/85  Pulse 75  Temp(Src) 97.8 F (36.6 C) (Oral)  Resp 14  Ht 5\' 11"  (1.803 m)  Wt 155 lb (70.308 kg)  BMI 21.63 kg/m2  SpO2 98% Physical Exam CONSTITUTIONAL: Well developed/well nourished HEAD: Normocephalic/atraumatic EYES: EOMI/PERRL, no nystagmus,  no ptosis ENMT: Mucous membranes moist NECK: supple no meningeal signs, no bruits CV: S1/S2 noted, no murmurs/rubs/gallops noted LUNGS: Lungs are clear to auscultation bilaterally, no apparent distress ABDOMEN: soft, nontender, no rebound or guarding GU:no cva tenderness NEURO:Awake/alert, facies symmetric, no arm or leg drift is noted Equal 5/5 strength with shoulder abduction, elbow flex/extension, wrist flex/extension in upper extremities and equal hand grips bilaterally Equal 5/5 strength with hip flexion,knee flex/extension, foot dorsi/plantar flexion Cranial nerves 3/4/5/6/03/16/09/11/12 tested and intact Gait normal without ataxia No past pointing Sensation to light touch intact in all extremities EXTREMITIES: pulses normal, full ROM SKIN: warm, color normal PSYCH: no abnormalities of mood noted  ED Course  Procedures   7:26 AM Pt well appearing, back to baseline, reports near syncopal episode but does not appear to have any high risk features Pt stable for d/c home   Labs Review Labs Reviewed  CBG MONITORING, ED - Abnormal; Notable for the following:  Glucose-Capillary 110 (*)    All other components within normal limits      EKG Interpretation   Date/Time:  Tuesday Jan 31 2014 07:09:37 EDT Ventricular Rate:  69 PR Interval:  139 QRS Duration: 91 QT Interval:  396 QTC Calculation: 424 R Axis:   -21 Text Interpretation:  Sinus arrhythmia Borderline left axis deviation ST  elev, probable normal  early repol pattern Confirmed by Bebe ShaggyWICKLINE  MD,  Telly Broberg ([id number]) on 01/31/2014 7:19:00 AM      MDM   Final diagnoses:  Near syncope    Nursing notes including past medical history and social history reviewed and considered in documentation     Joya Gaskinsonald W Jamichael Knotts, MD 01/31/14 [phone]

## 2014-01-31 NOTE — ED Notes (Signed)
Pt. reports dizziness with nausea onset this morning while at work , ambulatory , dizziness " tunnel vision " worse when standing for long period .

## 2014-02-17 ENCOUNTER — Encounter (HOSPITAL_COMMUNITY): Payer: Self-pay | Admitting: Emergency Medicine

## 2014-02-17 ENCOUNTER — Emergency Department (HOSPITAL_COMMUNITY)
Admission: EM | Admit: 2014-02-17 | Discharge: 2014-02-17 | Disposition: A | Discharge status: Home / Self Care | Payer: Self-pay | Attending: Emergency Medicine | Admitting: Emergency Medicine

## 2014-02-17 ENCOUNTER — Emergency Department (HOSPITAL_COMMUNITY): Payer: Self-pay

## 2014-02-17 DIAGNOSIS — IMO0002 Reserved for concepts with insufficient information to code with codable children: Secondary | ICD-10-CM | POA: Insufficient documentation

## 2014-02-17 DIAGNOSIS — F17200 Nicotine dependence, unspecified, uncomplicated: Secondary | ICD-10-CM | POA: Insufficient documentation

## 2014-02-17 DIAGNOSIS — S7010XA Contusion of unspecified thigh, initial encounter: Secondary | ICD-10-CM | POA: Insufficient documentation

## 2014-02-17 DIAGNOSIS — Y9289 Other specified places as the place of occurrence of the external cause: Secondary | ICD-10-CM | POA: Insufficient documentation

## 2014-02-17 DIAGNOSIS — Y9389 Activity, other specified: Secondary | ICD-10-CM | POA: Insufficient documentation

## 2014-02-17 MED ORDER — HYDROCODONE-ACETAMINOPHEN 5-325 MG PO TABS
1.0000 | ORAL_TABLET | Freq: Four times a day (QID) | ORAL | Status: DC | PRN
Start: 2014-02-17 — End: 2014-05-23

## 2014-02-17 MED ORDER — ONDANSETRON 4 MG PO TBDP: ORAL_TABLET | ORAL | Status: DC

## 2014-02-17 MED ORDER — ONDANSETRON HCL 4 MG/2ML IJ SOLN
4.0000 mg | Freq: Once | INTRAMUSCULAR | Status: AC
  Administered 2014-02-17: 4 mg via INTRAVENOUS
  Filled 2014-02-17: qty 2

## 2014-02-17 MED ORDER — SODIUM CHLORIDE 0.9 % IV BOLUS (SEPSIS)
1000.0000 mL | Freq: Once | INTRAVENOUS | Status: AC
  Administered 2014-02-17: 1000 mL via INTRAVENOUS

## 2014-02-17 MED ORDER — MORPHINE SULFATE 4 MG/ML IJ SOLN
6.0000 mg | Freq: Once | INTRAMUSCULAR | Status: AC
  Administered 2014-02-17: 6 mg via INTRAVENOUS
  Filled 2014-02-17: qty 2

## 2014-02-17 MED ORDER — HYDROCODONE-ACETAMINOPHEN 5-325 MG PO TABS
1.0000 | ORAL_TABLET | Freq: Once | ORAL | Status: AC
  Administered 2014-02-17: 1 via ORAL
  Filled 2014-02-17: qty 1
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
## 2014-02-17 NOTE — ED Notes (Signed)
Pt playing games on his i-pad, asking how much longer it is going to be, stating that he, "is getting hungry", no dyspnea or signs of distress, alert, NAD, calm. Pain med given.

## 2014-02-17 NOTE — ED Notes (Signed)
Pt ambulated with steady gait with limp, "feel better", "walking better than when I came in", (denies: L knee, ankle, foot or hip pain), pinpoints pain to L vastus lateralis muscle area, VSS, pt states, "ready to go".
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
## 2014-02-17 NOTE — ED Notes (Addendum)
Back from imaging, pt alert, NAD, calm, interactive, resps e/u, speaking in clear complete sentences, (denies sob, nausea dizziness or sx other than pain), c/o 8/10 pain, worse after imaging, was down to a 4/10 until imaging. Asking about prescriptions for pain medicine. [facility]
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
## 2014-05-23 ENCOUNTER — Emergency Department (HOSPITAL_COMMUNITY): Payer: Self-pay

## 2014-05-23 ENCOUNTER — Encounter (HOSPITAL_COMMUNITY): Payer: Self-pay | Admitting: Emergency Medicine

## 2014-05-23 ENCOUNTER — Emergency Department (HOSPITAL_COMMUNITY)
Admission: EM | Admit: 2014-05-23 | Discharge: 2014-05-23 | Disposition: A | Discharge status: Home / Self Care | Payer: Self-pay | Attending: Emergency Medicine | Admitting: Emergency Medicine

## 2014-05-23 DIAGNOSIS — F17200 Nicotine dependence, unspecified, uncomplicated: Secondary | ICD-10-CM | POA: Insufficient documentation

## 2014-05-23 DIAGNOSIS — R112 Nausea with vomiting, unspecified: Secondary | ICD-10-CM | POA: Insufficient documentation

## 2014-05-23 DIAGNOSIS — R109 Unspecified abdominal pain: Secondary | ICD-10-CM | POA: Insufficient documentation

## 2014-05-23 LAB — COMPREHENSIVE METABOLIC PANEL
ALT: 38 U/L (ref 0–53)
ANION GAP: 20 — AB (ref 5–15)
AST: 35 U/L (ref 0–37)
Albumin: 4.5 g/dL (ref 3.5–5.2)
Alkaline Phosphatase: 84 U/L (ref 39–117)
BUN: 13 mg/dL (ref 6–23)
CALCIUM: 10 mg/dL (ref 8.4–10.5)
CO2: 29 mEq/L (ref 19–32)
Chloride: 95 mEq/L — ABNORMAL LOW (ref 96–112)
Creatinine, Ser: 1.03 mg/dL (ref 0.50–1.35)
GFR calc Af Amer: >90 mL/min (ref >90)
GFR calc non Af Amer: >90 mL/min (ref >90)
Glucose, Bld: 146 mg/dL — ABNORMAL HIGH (ref 70–99)
Potassium: 3.5 mEq/L — ABNORMAL LOW (ref 3.7–5.3)
Sodium: 144 mEq/L (ref 137–147)
TOTAL PROTEIN: 7.5 g/dL (ref 6.0–8.3)
Total Bilirubin: 1.3 mg/dL — ABNORMAL HIGH (ref 0.3–1.2)

## 2014-05-23 LAB — CBC WITH DIFFERENTIAL/PLATELET
Basophils Absolute: 0 10*3/uL (ref 0.0–0.1)
Basophils Relative: 0 % (ref 0–1)
EOS ABS: 0 10*3/uL (ref 0.0–0.7)
EOS PCT: 0 % (ref 0–5)
HCT: 50.5 % (ref 39.0–52.0)
HEMOGLOBIN: 18.1 g/dL — AB (ref 13.0–17.0)
LYMPHS ABS: 1.4 10*3/uL (ref 0.7–4.0)
Lymphocytes Relative: 8 % — ABNORMAL LOW (ref 12–46)
MCH: 32.6 pg (ref 26.0–34.0)
MCHC: 35.8 g/dL (ref 30.0–36.0)
MCV: 91 fL (ref 78.0–100.0)
MONO ABS: 0.9 10*3/uL (ref 0.1–1.0)
Monocytes Relative: 5 % (ref 3–12)
Neutro Abs: 15.3 10*3/uL — ABNORMAL HIGH (ref 1.7–7.7)
Neutrophils Relative %: 87 % — ABNORMAL HIGH (ref 43–77)
Platelets: 193 10*3/uL (ref 150–400)
RBC: 5.55 MIL/uL (ref 4.22–5.81)
RDW: 12.3 % (ref 11.5–15.5)
WBC: 17.6 10*3/uL — ABNORMAL HIGH (ref 4.0–10.5)

## 2014-05-23 LAB — URINALYSIS, ROUTINE W REFLEX MICROSCOPIC
Glucose, UA: NEGATIVE mg/dL
Nitrite: NEGATIVE
PROTEIN: 100 mg/dL — AB
Specific Gravity, Urine: 1.023 (ref 1.005–1.030)
Urobilinogen, UA: 0.2 mg/dL (ref 0.0–1.0)
pH: 8.5 — ABNORMAL HIGH (ref 5.0–8.0)

## 2014-05-23 LAB — URINE MICROSCOPIC-ADD ON

## 2014-05-23 LAB — LIPASE, BLOOD: Lipase: 27 U/L (ref 11–59)

## 2014-05-23 MED ORDER — PROMETHAZINE HCL 25 MG PO TABS: 25.0000 mg | ORAL_TABLET | Freq: Four times a day (QID) | ORAL | Status: DC | PRN

## 2014-05-23 MED ORDER — ONDANSETRON 4 MG PO TBDP
8.0000 mg | ORAL_TABLET | Freq: Once | ORAL | Status: AC
  Administered 2014-05-23: 8 mg via ORAL
  Filled 2014-05-23: qty 2

## 2014-05-23 MED ORDER — SODIUM CHLORIDE 0.9 % IV BOLUS (SEPSIS): 1000.0000 mL | Freq: Once | INTRAVENOUS | Status: DC

## 2014-05-23 MED ORDER — DICYCLOMINE HCL 10 MG PO CAPS
10.0000 mg | ORAL_CAPSULE | Freq: Once | ORAL | Status: AC
  Administered 2014-05-23: 10 mg via ORAL
  Filled 2014-05-23: qty 1

## 2014-05-23 NOTE — ED Notes (Signed)
Pt c/o abd cramping and n/v that started last night. Denies blood in emesis/fevers at home. Nad, skin warm and dry, resp e/u.

## 2014-05-23 NOTE — ED Provider Notes (Signed)
CSN: [id number]     Arrival date & time 05/23/14  1404 History   First MD Initiated Contact with Patient 05/23/14 1559     Chief Complaint  Patient presents with  . Nausea  . Emesis  . Abdominal Pain     (Consider location/radiation/quality/duration/timing/severity/associated sxs/prior Treatment) HPI  Donald Tate is a 28 y.o. male who is otherwise healthy presenting to the ED after multiple episodes of bilious emesis onset last night. Patient states he was in his normal state of health, was drinking "a little alcohol" [place] acutely vomiting. Patient drinks 3x 40 ounce beers, which is normal for him. States that he had them over the course of the day. Patient does not drink daily and has not had any history of DTs or alcohol withdrawal. States the vomiting is nonbilious and does not appear coffee ground like. Patient reports mild diffuse abdominal cramping which has resolved after he was given Zofran ODT in the waiting room. Patient denies fever, chills, sick contacts, rash, headache, chest pain, shortness of breath. Patient reports decreased urine output on review of systems. Denies change in all habits.  History reviewed. No pertinent past medical history. Past Surgical History  Procedure Laterality Date  . Hernia repair     No family history on file. History  Substance Use Topics  . Smoking status: Current Every Day Smoker -- 0.50 packs/day    Types: Cigarettes  . Smokeless tobacco: Not on file  . Alcohol Use: 16.8 oz/week    28 Cans of beer per week     Comment: occasionally    Review of Systems  10 systems reviewed and found to be negative, except as noted in the HPI.   Allergies  Review of patient's allergies indicates no known allergies.  Home Medications   Prior to Admission medications   Medication Sig Start Date End Date Taking? Authorizing Provider  ranitidine (ZANTAC) 150 MG tablet Take 150 mg by mouth 2 (two) times daily as needed for heartburn.   Yes  Historical Provider, MD  promethazine (PHENERGAN) 25 MG tablet Take 1 tablet (25 mg total) by mouth every 6 (six) hours as needed for nausea or vomiting. 05/23/14   Joni Reining Lania Zawistowski, PA-C   BP 139/107  Pulse 79  Temp(Src) 98.5 F (36.9 C) (Oral)  Resp 16  SpO2 97% Physical Exam  ED Course  Procedures (including critical care time) Labs Review Labs Reviewed  CBC WITH DIFFERENTIAL - Abnormal; Notable for the following:    WBC 17.6 (*)    Hemoglobin 18.1 (*)    Neutrophils Relative % 87 (*)    Neutro Abs 15.3 (*)    Lymphocytes Relative 8 (*)    All other components within normal limits  COMPREHENSIVE METABOLIC PANEL - Abnormal; Notable for the following:    Potassium 3.5 (*)    Chloride 95 (*)    Glucose, Bld 146 (*)    Total Bilirubin 1.3 (*)    Anion gap 20 (*)    All other components within normal limits  URINALYSIS, ROUTINE W REFLEX MICROSCOPIC - Abnormal; Notable for the following:    Color, Urine AMBER (*)    APPearance CLOUDY (*)    pH 8.5 (*)    Hgb urine dipstick LARGE (*)    Bilirubin Urine SMALL (*)    Ketones, ur >80 (*)    Protein, ur 100 (*)    Leukocytes, UA SMALL (*)    All other components within normal limits  URINE MICROSCOPIC-ADD ON -  Abnormal; Notable for the following:    Bacteria, UA FEW (*)    All other components within normal limits  LIPASE, BLOOD    Imaging Review US Abdomen Complete  05/23/2014   CLINICAL DATA:  Bilious emesis  EXAM: ULTRASOUND ABDOMEN COMPLETE  COMPARISON:  None.  FINDINGS: Gallbladder:  No gallstones or wall thickening visualized. No sonographic Murphy sign noted.  Common bile duct:  Diameter: 4.8 mm  Liver:  No focal lesion identified. Within normal limits in parenchymal echogenicity.  IVC:  No abnormality visualized.  Pancreas:  Visualized portion unremarkable.  Spleen:  Size and appearance within normal limits.  Right Kidney:  Length: 11.0 cm. Echogenicity within normal limits. No mass or hydronephrosis visualized.  Left  Kidney:  Length: 10.8 cm. Echogenicity within normal limits. No mass or hydronephrosis visualized.  Abdominal aorta:  No aneurysm visualized.  Other findings:  None.  IMPRESSION: Unremarkable abdominal ultrasound.  No cholelithiasis or sonographic evidence for acute cholecystitis. No biliary ductal dilatation.   Electronically Signed   By: Annia Belt M.D.   On: 05/23/2014 17:23     EKG Interpretation None      MDM   Final diagnoses:  Non-intractable vomiting with nausea, vomiting of unspecified type    Filed Vitals:   05/23/14 1408 05/23/14 1555 05/23/14 1620  BP: 113/90 139/88 139/107  Pulse: 99 72 79  Temp: 98.6 F (37 C) 98.5 F (36.9 C)   TempSrc: Oral Oral   Resp: 20  16  SpO2: 99% 97% 97%    Medications  sodium chloride 0.9 % bolus 1,000 mL (not administered)  ondansetron (ZOFRAN-ODT) disintegrating tablet 8 mg (8 mg Oral Given 05/23/14 1414)  dicyclomine (BENTYL) capsule 10 mg (10 mg Oral Given 05/23/14 1638)    Donald Tate is a 28 y.o. male presenting with multiple episodes of emesis after drinking heavily last night. Very mild right-sided abdominal tenderness and elevated total bilirubin. Patient reports bilious emesis;  abdominal ultrasound is  is negative. Blood and urine consistent with dehydration. No urinary symptoms. Serial abdominal exams remain benign. Patient is tolerating by mouth and is thirsty, drinking well. States that he feels better without IV fluids. Return precautions discussed and patient discharged home with Phenergan.  Evaluation does not show pathology that would require ongoing emergent intervention or inpatient treatment. Pt is hemodynamically stable and mentating appropriately. Discussed findings and plan with patient/guardian, who agrees with care plan. All questions answered. Return precautions discussed and outpatient follow up given.   New Prescriptions   PROMETHAZINE (PHENERGAN) 25 MG TABLET    Take 1 tablet (25 mg total) by mouth every 6  (six) hours as needed for nausea or vomiting.         Wynetta Emery, PA-C 05/24/14 [phone]

## 2014-05-23 NOTE — ED Notes (Signed)
Patient was given a ginger ale with ice.

## 2014-05-23 NOTE — Discharge Instructions (Signed)
Push fluids: take small frequent sips of water or Gatorade, do not drink any soda, juice or caffeinated beverages.   ° °Slowly resume solid diet as desired. Avoid food that are spicy, contain dairy and/or have high fat content. ° °Aviod NSAIDs (aspirin, motrin, ibuprofen, naproxen, Aleve et cetera) for pain control because they will irritate your stomach. ° °Do not hesitate to return to the emergency room for any new, worsening or concerning symptoms. ° °Please obtain primary care using resource guide below. But the minute you were seen in the emergency room and that they will need to obtain records for further outpatient management. ° ° ° ° °Emergency Department Resource Guide °1) Find a Doctor and Pay Out of Pocket °Although you won't have to find out who is covered by your insurance plan, it is a good idea to ask around and get recommendations. You will then need to call the office and see if the doctor you have chosen will accept you as a new patient and what types of options they offer for patients who are self-pay. Some doctors offer discounts or will set up payment plans for their patients who do not have insurance, but you will need to ask so you aren't surprised when you get to your appointment. ° °2) Contact Your Local Health Department °Not all health departments have doctors that can see patients for sick visits, but many do, so it is worth a call to see if yours does. If you don't know where your local health department is, you can check in your phone book. The CDC also has a tool to help you locate your state's health department, and many state websites also have listings of all of their local health departments. ° °3) Find a Walk-in Clinic °If your illness is not likely to be very severe or complicated, you may want to try a walk in clinic. These are popping up all over the country in pharmacies, drugstores, and shopping centers. They're usually staffed by nurse practitioners or physician assistants  that have been trained to treat common illnesses and complaints. They're usually fairly quick and inexpensive. However, if you have serious medical issues or chronic medical problems, these are probably not your best option. ° °No Primary Care Doctor: °- Call [facility] at  832-8000 - they can help you locate a primary care doctor that  accepts your insurance, provides certain services, etc. °- Physician Referral Service- 1-800-533-3463 ° °Chronic Pain Problems: °Organization         Address  Phone   Notes  °[facility]  (336) 297-2271 Patients need to be referred by their primary care doctor.  ° °Medication Assistance: °Organization         Address  Phone   Notes  °[place] [facility] [address] °[place], [place] [address] (336) 641-8030 --Must be a resident of [place] °-- Must have NO insurance coverage whatsoever (no Medicaid/ Medicare, etc.) °-- The pt. MUST have a primary care doctor that directs their care regularly and follows them in the community °  °[facility]  (866) 331-1348   °[employer]  (888) 892-1162   ° °Agencies that provide inexpensive medical care: °Organization         Address  Phone   Notes  °[facility]  (336) 832-8035   °[facility]    (336) 832-7272   °[facility] [address] °[place], [place] [address] (336)   832-4777   °[facility] [address], °[place] (336) 271-4999   °[facility]    (336) 373-0678   °[facility]    (336) 272-1050   °[facility] ° [address], [place] Phone:  (336) 832-4444, Fax:  (336) 832-4440 Hours of Operation:  9 am - 6 pm, M-F.  Also accepts Medicaid/Medicare and self-pay.  °[facility] ° [address], [place] Phone: (336) 832-3150, Fax: (336) 832-3151. Hours of Operation:  8:30 am - 5:30 pm, M-F.  Also accepts Medicaid  and self-pay.  °[facility] [place] [address], [place] Phone: (336) 878-6027   °[facility] [address], [place], [place] (336)723-1848, Ext. 123 Mondays & Thursdays: 7-9 AM.  First 15 patients are seen on a first come, first serve basis. °  ° °Medicaid-accepting [place] Providers: ° °Organization         Address  Phone   Notes  °[facility] [address], [place] (336) 641-2100 Also accepts self-pay patients.  °[facility] [address], [place] ° (336) 856-9996   °[facility] [address], [place] (336) 288-8857   °[facility] [address] [place] [address], [place] (336) 299-7000   °Veita Bland [address], [place]  ° (336) 373-1557 Only accepts [place] Access Medicaid patients after they have their name applied to their card.  ° °Self-Pay (no insurance) in [place]: ° °Organization         Address  Phone   Notes  °Sickle Cell Patients, [facility] [address], [place] (336) 832-1970   °[facility] [address], [place] (336) 832-4400   °[facility] ° [address], [place] (336) 992-4800   °[facility]/Dr. Osei-Bonsu ° [address] [place] [address], [place] or [address], [place] (336) 841-8500 Phone number for both [place] and [place] locations is the same.  °[facility] [address], [place] (336) 299-0000   °[facility] [address] [place] [address],  or [address] (336) 852-7530 °(336) 878-2260   °[facility] [address],  (336) 350-1642, phone; (336) 294-5005, fax Sees patients 1st and 3rd Saturday of every month.  Must not qualify for public or private insurance (i.e. Medicaid, Medicare, [place] Health Choice, Veterans' Benefits) • Household income  should be no more than 200% of the poverty level •The clinic cannot treat you if you are pregnant or think you are pregnant • Sexually transmitted diseases are not treated at the clinic.  ° ° °Dental Care: °Organization         Address  Phone  Notes  °[facility] [address],  (336) 641-6152 Accepts children up to age 21 who are enrolled in Medicaid or [place] Health Choice; pregnant women with a Medicaid card; and children who have applied for Medicaid or [place] Health Choice, but were declined, whose parents can pay a reduced fee at time of service.  °[facility] [place]  [address], [place] (336) 641-7733 Accepts children up to age 21 who are enrolled in Medicaid or [place] Health Choice; pregnant women with a   Medicaid card; and children who have applied for Medicaid or [place] Health Choice, but were declined, whose parents can pay a reduced fee at time of service.  °[facility] ° [address], [place] (336) 641-4533 Patients are seen by appointment only. Walk-ins are not accepted. [facility] will see patients 18 years of age and older. °Monday - Tuesday (8am-5pm) °Most Wednesdays (8:30-5pm) °$30 per visit, cash only  °[facility] ° [address], [place] (336) 641-4533 Patients are seen by appointment only. Walk-ins are not accepted. [facility] will see patients 18 years of age and older. °One Wednesday Evening (Monthly: Volunteer Based).  $30 per visit, cash only  °[facility]  (919) 537-3737 for adults; Children under age 4, call Graduate Pediatric Dentistry at (919) [phone]. Children aged 4-14, please call (919) [phone] to request a pediatric application. ° Dental services are provided in all areas of dental care including fillings, crowns and bridges, complete and partial dentures, implants, gum treatment,  root canals, and extractions. Preventive care is also provided. Treatment is provided to both adults and children. °Patients are selected via a lottery and there is often a waiting list. °  °[facility] [address], °[place] ° (336) 763-8833 [email or web address] °  °[facility] [address], [place], [place] (336)723-1848, Ext. 123 Second and Fourth Thursday of each month, opens at 6:30 AM; Clinic ends at 9 AM.  Patients are seen on a first-come first-served basis, and a limited number are seen during each clinic.  ° °[facility] ° [address], [place], [place] (336) 723-7904   Eligibility Requirements °You must have lived in [place], [place], or [place] counties for at least the last three months. °  You cannot be eligible for state or federal sponsored healthcare insurance, including Veterans Administration, Medicaid, or Medicare. °  You generally cannot be eligible for healthcare insurance through your employer.  °  How to apply: °Eligibility screenings are held every Tuesday and Wednesday afternoon from 1:00 pm until 4:00 pm. You do not need an appointment for the interview!  °[facility] [address], [place], [place] 336-631-2330   °[facility]  336-342-8273   °[facility]  336-703-3100   °[facility]  336-570-6415   ° °Behavioral Health Resources in the Community: °Intensive Outpatient Programs °Organization         Address  Phone  Notes  °[facility] [address], [place], [place] 336-878-6098   °[facility] [address], [place], [place] 336-832-9800   °[facility] [address], [place], [place] ° 336-882-2125   °[facility] [address],  °[place], [place] 1-800-853-5163 or 336-641-4981   °Substance Abuse Resources °Organization         Address  Phone  Notes  °[facility]   336-882-2125   °[facility]  336-784-9470   °[facility] [place] [facility]  336-285-9073   °[facility]  336-845-3988   °Residential & Outpatient Substance Abuse Program  1-800-659-3381   °Psychological Services °Organization         Address  Phone  Notes  °[facility]  336- 832-9600   °[facility]  336- 378-7881   °[facility] [address], [place] 1-800-853-5163 or 336-641-4981   ° °Mobile Crisis Teams °Organization           Address  Phone  Notes  °[facility], Mobile Crisis Care Unit  1-877-626-1772   °Assertive °[facility] ° [address] [place], [place] 336-834-9664   °Sharon DeEsch [address] °[place] 336-554-5454   ° °Self-Help/Support Groups °Organization         Address  Phone             Notes  °[facility] - variety of support groups  336- 373-1402 Call for more information  °Narcotics Anonymous (NA), [facility] [address], °[place]  2 meetings at this location  ° °Residential Treatment Programs °Organization         Address  Phone  Notes  °[facility] [address],    °[place]  1-866-801-8205   °[facility] ° [address], [place], [place] 704-293-8524   °[facility] [address], [place] 336-845-3988 Admissions: 8am-3pm M-F  °[facility] [address]    °[place], [place] 336-841-1104   °[facility] [address], [place], [place] 336-379-7146   °[facility] [place] [facility] [address]  °[place], [place] 336-285-9073   °[facility] - Intensive Outpatient [address], [place], [place] 336-852-3033   °[facility] [address]  °[place], [place] 1-877-615-2722 or 336-784-9470   °[facility] [address] [place], [place] 336-227-7417 Accepts Medicaid  °[facility] [address]  ° [place] 1-800-659-3381 Substance Abuse/Addiction Treatment  ° °[place] Behavioral Health Resources °Organization         Address  Phone  Notes  °[facility]  (888) 581-9988   °Julie Brannon, PhD [address] [place], [place]   (336) 349-5553 or (336) 951-0000   °[facility]   [address] °[place], [place] (336) 349-4454   °[facility] [address], [place], [place] (336) 342-8316 Insurance/Medicaid/sponsorship through [facility]  °[facility] [address]                                    [place], [place] (336) 342-8316 Therapy/tele-psych/case  °[facility] [address]  ° [place], [place] (336) 349-2233    °Dr. Arfeen  (336) 349-4544   °[facility] [place]  [employer] [place] [facility] 1) [address],  °2) [address], [place] °3)  [address], [place] (336) 349-3220 °(336) 342-7768 ° °(336) 342-8140   °[place] [facility] (336) 342-1394 or (336) 342-3537 (After Hours)    ° ° ° ° °

## 2014-05-23 NOTE — ED Notes (Signed)
Apologized to pt for wait time. Pt denies any pain or nausea at this time. NAD.

## 2014-05-24 LAB — GC/CHLAMYDIA PROBE AMP
CT Probe RNA: NEGATIVE
GC Probe RNA: NEGATIVE

## 2014-05-25 LAB — URINE CULTURE
CULTURE: NO GROWTH
Colony Count: NO GROWTH

## 2014-05-26 NOTE — ED Provider Notes (Signed)
History/physical exam/procedure(s) were performed by non-physician practitioner and as supervising physician I was immediately available for consultation/collaboration. I have reviewed all notes and am in agreement with care and plan.   Zaul Hubers S Lazara Grieser, MD 05/26/14 0850 

## 2019-05-13 ENCOUNTER — Encounter (HOSPITAL_COMMUNITY): Payer: Self-pay

## 2019-05-13 ENCOUNTER — Other Ambulatory Visit: Payer: Self-pay

## 2019-05-13 ENCOUNTER — Emergency Department (HOSPITAL_COMMUNITY)
Admission: EM | Admit: 2019-05-13 | Discharge: 2019-05-13 | Disposition: A | Discharge status: Home / Self Care | Payer: Self-pay | Attending: Emergency Medicine | Admitting: Emergency Medicine

## 2019-05-13 DIAGNOSIS — F1210 Cannabis abuse, uncomplicated: Secondary | ICD-10-CM | POA: Insufficient documentation

## 2019-05-13 DIAGNOSIS — F17210 Nicotine dependence, cigarettes, uncomplicated: Secondary | ICD-10-CM | POA: Insufficient documentation

## 2019-05-13 DIAGNOSIS — K625 Hemorrhage of anus and rectum: Secondary | ICD-10-CM | POA: Insufficient documentation

## 2019-05-13 LAB — COMPREHENSIVE METABOLIC PANEL
ALT: 48 U/L — ABNORMAL HIGH (ref 0–44)
AST: 42 U/L — ABNORMAL HIGH (ref 15–41)
Albumin: 4.5 g/dL (ref 3.5–5.0)
Alkaline Phosphatase: 65 U/L (ref 38–126)
Anion gap: 11 (ref 5–15)
BUN: 16 mg/dL (ref 6–20)
CO2: 26 mmol/L (ref 22–32)
Calcium: 9.8 mg/dL (ref 8.9–10.3)
Chloride: 100 mmol/L (ref 98–111)
Creatinine, Ser: 0.97 mg/dL (ref 0.61–1.24)
GFR calc Af Amer: >60 mL/min (ref >60)
GFR calc non Af Amer: >60 mL/min (ref >60)
Glucose, Bld: 104 mg/dL — ABNORMAL HIGH (ref 70–99)
Potassium: 4.3 mmol/L (ref 3.5–5.1)
Sodium: 137 mmol/L (ref 135–145)
Total Bilirubin: 0.8 mg/dL (ref 0.3–1.2)
Total Protein: 7.4 g/dL (ref 6.5–8.1)

## 2019-05-13 LAB — CBC
HCT: 47 % (ref 39.0–52.0)
Hemoglobin: 15.5 g/dL (ref 13.0–17.0)
MCH: 32.7 pg (ref 26.0–34.0)
MCHC: 33 g/dL (ref 30.0–36.0)
MCV: 99.2 fL (ref 80.0–100.0)
Platelets: 171 10*3/uL (ref 150–400)
RBC: 4.74 MIL/uL (ref 4.22–5.81)
RDW: 11.9 % (ref 11.5–15.5)
WBC: 7.4 10*3/uL (ref 4.0–10.5)
nRBC: 0 % (ref 0.0–0.2)

## 2019-05-13 LAB — POC OCCULT BLOOD, ED: Fecal Occult Bld: NEGATIVE

## 2019-05-13 NOTE — ED Provider Notes (Signed)
[facility]   CSN: [id number] Arrival date & time: 05/13/19  [id number]     History   Chief Complaint Chief Complaint  Patient presents with  . Rectal Bleeding    HPI Donald PillarFred W Mealor Jr. is a 33 y.o. male presenting for evaluation of rectal bleeding.  Patient states he has intermittently had rectal bleeding from his hemorrhoid before.  However for the past week, he reports worsening rectal bleeding.  States he is noticing a lot of blood when he wipes.  Also noticing blood in the toilet bowl.  Occasionally bleeding between bowel movements.  Blood is bright red, not dark or tarry.  He denies fevers, chills, nausea, vomiting, abdominal pain, urinary symptoms.  He states his stools been slightly loose.  He has no medical problems, takes no medications daily.  He has never had a colonoscopy.  He reports familial history of cancers, but no known colon cancer in his family.  He denies weight changes. Patient smokes close to a pack of cigarettes a day.  Reports 6-7 beers every day.  Smokes marijuana daily.  No other drug use.     HPI  History reviewed. No pertinent past medical history.  There are no active problems to display for this patient.   Past Surgical History:  Procedure Laterality Date  . HERNIA REPAIR          Home Medications    Prior to Admission medications   Not on File    Family History History reviewed. No pertinent family history.  Social History Social History   Tobacco Use  . Smoking status: Current Every Day Smoker    Types: Cigarettes  . Smokeless tobacco: Never Used  Substance Use Topics  . Alcohol use: Yes    Comment: 6 or more beers a day   . Drug use: Yes    Types: Marijuana     Allergies   Patient has no known allergies.   Review of Systems Review of Systems  Gastrointestinal: Positive for blood in stool and diarrhea.  All other systems reviewed and are negative.    Physical Exam  Updated Vital Signs BP (!) 139/93 (BP Location: Left Arm)   Pulse 89   Temp 98.6 F (37 C) (Oral)   Resp 18   Ht 5\' 11"  (1.803 m)   Wt 75.8 kg   SpO2 99%   BMI 23.29 kg/m   Physical Exam Vitals signs and nursing note reviewed. Exam conducted with a chaperone present.  Constitutional:      General: He is not in acute distress.    Appearance: He is well-developed.     Comments: Anxious, but nontoxic  HENT:     Head: Normocephalic and atraumatic.  Eyes:     Extraocular Movements: Extraocular movements intact.     Conjunctiva/sclera: Conjunctivae normal.     Pupils: Pupils are equal, round, and reactive to light.  Neck:     Musculoskeletal: Normal range of motion and neck supple.  Cardiovascular:     Rate and Rhythm: Normal rate and regular rhythm.     Pulses: Normal pulses.     Comments: Heart rate normal on my exam Pulmonary:     Effort: Pulmonary effort is normal. No respiratory distress.     Breath sounds: Normal breath sounds. No wheezing.  Abdominal:     General: There is no distension.     Palpations: Abdomen is soft. There is no mass.     Tenderness:  There is no abdominal tenderness. There is no guarding or rebound.     Comments: No tenderness palpation the abdomen.  Soft without rigidity, guarding, distention.  Negative rebound.  Genitourinary:    Rectum: Normal. Guaiac result negative. No mass, tenderness, anal fissure or external hemorrhoid.     Comments: Chaperone present.  No obvious external hemorrhoids.  No gross blood on exam. Hemoccult negative. Musculoskeletal: Normal range of motion.  Skin:    General: Skin is warm and dry.     Capillary Refill: Capillary refill takes less than 2 seconds.  Neurological:     Mental Status: He is alert and oriented to person, place, and time.  Psychiatric:        Mood and Affect: Mood is anxious.     Comments: Patient appears anxious, and states he is feeling very nervous about what is happening.      ED Treatments  / Results  Labs (all labs ordered are listed, but only abnormal results are displayed) Labs Reviewed  COMPREHENSIVE METABOLIC PANEL - Abnormal; Notable for the following components:      Result Value   Glucose, Bld 104 (*)    AST 42 (*)    ALT 48 (*)    All other components within normal limits  CBC  POC OCCULT BLOOD, ED    EKG None  Radiology No results found.  Procedures Procedures (including critical care time)  Medications Ordered in ED Medications - No data to display   Initial Impression / Assessment and Plan / ED Course  I have reviewed the triage vital signs and the nursing notes.  Pertinent labs & imaging results that were available during my care of the patient were reviewed by me and considered in my medical decision making (see chart for details).        Patient presenting for evaluation of rectal bleeding.  Physical exam reassuring, appears nontoxic.  No abdominal tenderness, nausea, vomiting, fevers.  Doubt infection such as diverticulitis.  Rectal exam without gross bleeding.  Patient is not on blood thinners.  Will check basic labs to assess hemoglobin, however patient likely will be able to follow-up outpatient.  Hemoglobin stable.  No leukocytosis.  Liver enzymes barely elevated, likely due to chronic alcohol use.  Discussed cessation of alcohol and smoking.  Discussed importance of follow-up with GI.  Encouraged patient to establish with primary care doctor.  At this time, patient appears safe for discharge.  Return precautions given.  Patient states he understands and agrees to plan.  Final Clinical Impressions(s) / ED Diagnoses   Final diagnoses:  Rectal bleeding    ED Discharge Orders    None       Franchot Heidelberg, PA-C 05/13/19 1307    Carmin Muskrat, MD 05/13/19 1433

## 2019-05-13 NOTE — ED Triage Notes (Signed)
Pt presents with rectal bleeding x1 week.

## 2019-05-13 NOTE — Discharge Instructions (Signed)
It is important that you follow-up with a GI doctor for further evaluation.   Follow up with a primary care doctor.  There is information for a clinic listed below. Stop drinking alcohol.  This is likely contributing to your bleeding. Decrease the amount that you are smoking (stop smoking if able). Return to the emergency room if you develop high fevers, vomiting, severe abdominal pain, or any new, worsening, concerning symptoms.

## 2019-05-13 NOTE — ED Notes (Signed)
Patient verbalizes understanding of discharge instructions. Opportunity for questioning and answers were provided. Armband removed by staff, pt discharged from ED.  

## 2019-07-08 ENCOUNTER — Encounter (HOSPITAL_COMMUNITY): Payer: Self-pay | Admitting: Emergency Medicine

## 2019-11-22 ENCOUNTER — Emergency Department (HOSPITAL_COMMUNITY)
Admission: EM | Admit: 2019-11-22 | Discharge: 2019-11-22 | Disposition: A | Discharge status: Home / Self Care | Payer: Managed Care, Other (non HMO) | Attending: Emergency Medicine | Admitting: Emergency Medicine

## 2019-11-22 ENCOUNTER — Emergency Department (HOSPITAL_COMMUNITY): Payer: Managed Care, Other (non HMO)

## 2019-11-22 ENCOUNTER — Other Ambulatory Visit: Payer: Self-pay

## 2019-11-22 ENCOUNTER — Encounter (HOSPITAL_COMMUNITY): Payer: Self-pay | Admitting: Emergency Medicine

## 2019-11-22 DIAGNOSIS — S4992XA Unspecified injury of left shoulder and upper arm, initial encounter: Secondary | ICD-10-CM | POA: Diagnosis present

## 2019-11-22 DIAGNOSIS — Y999 Unspecified external cause status: Secondary | ICD-10-CM | POA: Insufficient documentation

## 2019-11-22 DIAGNOSIS — X58XXXA Exposure to other specified factors, initial encounter: Secondary | ICD-10-CM | POA: Insufficient documentation

## 2019-11-22 DIAGNOSIS — S46912A Strain of unspecified muscle, fascia and tendon at shoulder and upper arm level, left arm, initial encounter: Secondary | ICD-10-CM | POA: Insufficient documentation

## 2019-11-22 DIAGNOSIS — Y92003 Bedroom of unspecified non-institutional (private) residence as the place of occurrence of the external cause: Secondary | ICD-10-CM | POA: Insufficient documentation

## 2019-11-22 DIAGNOSIS — Y9389 Activity, other specified: Secondary | ICD-10-CM | POA: Diagnosis not present

## 2019-11-22 MED ORDER — DICLOFENAC SODIUM 1 % EX GEL: 2.0000 g | Freq: Four times a day (QID) | CUTANEOUS | 0 refills | Status: DC

## 2019-11-22 NOTE — Discharge Instructions (Signed)
Use the Voltaren gel as directed. Perform the range of motion exercises to prevent stiffness. Return to the ED if you start to have ischial injuries, worsening pain, swelling, numbness or weakness.

## 2019-11-22 NOTE — ED Provider Notes (Signed)
[facility] EMERGENCY DEPARTMENT Provider Note   CSN: [id number] Arrival date & time: 11/22/19  0731     History Chief Complaint  Patient presents with  . Shoulder Pain    Donald Tate is a 34 y.o. male who presents to ED with a chief complaint of left shoulder pain.  States that he was playing with his daughter last night while she was jumping on the bed.  He tried to catch her as she was about to fall off the bed and felt a pop in his left shoulder.  Reports aching and burning pain to his left shoulder that is worse with movement.  States that he has strained the shoulder in the past.  He has tried Advil with improvement in his pain.  He denies any prior fracture, dislocations or procedures in the area, neck pain or stiffness, direct fall or injury, numbness in arms or legs.  HPI     History reviewed. No pertinent past medical history.  There are no problems to display for this patient.   History reviewed. No pertinent surgical history.     No family history on file.  Social History   Tobacco Use  . Smoking status: Not on file  Substance Use Topics  . Alcohol use: Not on file  . Drug use: Not on file    Home Medications Prior to Admission medications   Medication Sig Start Date End Date Taking? Authorizing Provider  diclofenac Sodium (VOLTAREN) 1 % GEL Apply 2 g topically 4 (four) times daily. 11/22/19   Dietrich Pates, PA-C    Allergies    Patient has no known allergies.  Review of Systems   Review of Systems  Constitutional: Negative for chills and fever.  Musculoskeletal: Positive for arthralgias. Negative for myalgias.  Skin: Negative for wound.  Neurological: Negative for weakness and numbness.    Physical Exam Updated Vital Signs BP 137/87 (BP Location: Right Arm)   Pulse 78   Temp 98.1 F (36.7 C) (Oral)   Resp 15   SpO2 98%   Physical Exam Vitals and nursing note reviewed.  Constitutional:      General: He is not in acute  distress.    Appearance: He is well-developed. He is not diaphoretic.  HENT:     Head: Normocephalic and atraumatic.  Eyes:     General: No scleral icterus.    Conjunctiva/sclera: Conjunctivae normal.  Pulmonary:     Effort: Pulmonary effort is normal. No respiratory distress.  Musculoskeletal:        General: No tenderness.     Cervical back: Normal range of motion.     Comments: No tenderness palpation of the left shoulder.  Some pain noted with overhead reaching with the left shoulder.  No deformities, erythema noted.  Sensation intact to light touch of bilateral upper extremities.  Strength 5/5 in bilateral upper extremities.  2+ radial pulse palpated.  Skin:    Findings: No rash.  Neurological:     Mental Status: He is alert.     ED Results / Procedures / Treatments   Labs (all labs ordered are listed, but only abnormal results are displayed) Labs Reviewed - No data to display  EKG None  Radiology DG Shoulder Left  Result Date: 11/22/2019 CLINICAL DATA:  Felt pop, burning pain in shoulder EXAM: LEFT SHOULDER - 2+ VIEW COMPARISON:  None. FINDINGS: There is no evidence of fracture or dislocation. There is no evidence of arthropathy or other focal bone  abnormality. Soft tissues are unremarkable. IMPRESSION: No fracture or dislocation of the left shoulder. Joint spaces are preserved. Electronically Signed   By: Eddie Candle M.D.   On: 11/22/2019 08:23    Procedures Procedures (including critical care time)  Medications Ordered in ED Medications - No data to display  ED Course  I have reviewed the triage vital signs and the nursing notes.  Pertinent labs & imaging results that were available during my care of the patient were reviewed by me and considered in my medical decision making (see chart for details).    MDM Rules/Calculators/A&P                      34 year old male presents to ED with a chief complaint of left shoulder pain.  Felt a popping sensation last  night as he was trying to catch his daughter from falling off the bed.  Denies any direct injury.  On exam there is some pain with range of motion of the left shoulder without overlying skin changes, deformities, changes to sensation or pulse noted.  X-rays negative for acute abnormality.  Suspect that symptoms could be due to strain.  Doubt infectious or vascular cause of symptoms.  We will have him use Voltaren gel, give range of motion exercises, orthopedic follow-up and return for worsening symptoms.  Patient is hemodynamically stable, in NAD, and able to ambulate in the ED. Evaluation does not show pathology that would require ongoing emergent intervention or inpatient treatment. I have personally reviewed and interpreted all lab work and imaging at today's ED visit. I explained the diagnosis to the patient. Pain has been managed and has no complaints prior to discharge. Patient is comfortable with above plan and is stable for discharge at this time. All questions were answered prior to disposition. Strict return precautions for returning to the ED were discussed. Encouraged follow up with PCP.   An After Visit Summary was printed and given to the patient.   Portions of this note were generated with [employer]. Dictation errors may occur despite best attempts at proofreading.  Final Clinical Impression(s) / ED Diagnoses Final diagnoses:  Strain of left shoulder, initial encounter    Rx / DC Orders ED Discharge Orders         Ordered    diclofenac Sodium (VOLTAREN) 1 % GEL  4 times daily     11/22/19 0940           Delia Heady, PA-C 11/22/19 0941    Carmin Muskrat, MD 11/25/19 [phone]

## 2019-11-22 NOTE — ED Triage Notes (Signed)
Pt states he was playing around with girlfriend last night and grabbed her as she was about to fall off the bed,states he felt a pop, now having burning pain to shoulder. Took advil this am. Able to abduct to approx 90 degrees.

## 2019-11-23 ENCOUNTER — Encounter (HOSPITAL_COMMUNITY): Payer: Self-pay | Admitting: Emergency Medicine

## 2020-07-06 ENCOUNTER — Other Ambulatory Visit: Payer: Self-pay

## 2020-07-06 ENCOUNTER — Encounter (HOSPITAL_COMMUNITY): Payer: Self-pay | Admitting: Emergency Medicine

## 2020-07-06 ENCOUNTER — Emergency Department (HOSPITAL_COMMUNITY)
Admission: EM | Admit: 2020-07-06 | Discharge: 2020-07-06 | Disposition: A | Discharge status: Home / Self Care | Payer: Managed Care, Other (non HMO) | Attending: Emergency Medicine | Admitting: Emergency Medicine

## 2020-07-06 DIAGNOSIS — F17210 Nicotine dependence, cigarettes, uncomplicated: Secondary | ICD-10-CM | POA: Diagnosis not present

## 2020-07-06 DIAGNOSIS — H5711 Ocular pain, right eye: Secondary | ICD-10-CM | POA: Diagnosis present

## 2020-07-06 DIAGNOSIS — H0012 Chalazion right lower eyelid: Secondary | ICD-10-CM | POA: Diagnosis not present

## 2020-07-06 NOTE — Discharge Instructions (Addendum)
The bump under your eyelid is likely something called a chalazion.  You may try to apply warm compresses to the eye to help this go down.  If this does not help, please follow-up with Dr. Allena Katz with ophthalmology, please call her phone number to schedule an appointment.  Return to the ER if you have any new or worsening symptoms.

## 2020-07-06 NOTE — ED Provider Notes (Signed)
[facility] EMERGENCY DEPARTMENT Provider Note   CSN: [id number] Arrival date & time: 07/06/20  1106     History Chief Complaint  Patient presents with  . Eye Pain    Winferd Wease Claus Silvestro. is a 34 y.o. male.  HPI 34 year old male with no significant medical history presents to the ER with complaints of a bump under his eyelid which has been fluctuating in size for the last month.  Patient states that the bump has been fairly painless, however he decided to "mess with it" approximately a week ago.  Since then it has been increasing in size.  He feels as though it is getting bigger to the point that it is now it is visual field and thus decided to have this evaluated.  He denies any blurry vision.  The lump is not overly painful or warm.  Denies any fevers or chills.  Denies any eye drainage, no pain with eye movement, photophobia or eye pain. Denies any damage to the eye     History reviewed. No pertinent past medical history.  There are no problems to display for this patient.   Past Surgical History:  Procedure Laterality Date  . HERNIA REPAIR         No family history on file.  Social History   Tobacco Use  . Smoking status: Current Every Day Smoker    Packs/day: 0.50    Types: Cigarettes  . Smokeless tobacco: Never Used  Substance Use Topics  . Alcohol use: Yes    Comment: 6 or more beers a day   . Drug use: Yes    Types: Marijuana    Home Medications Prior to Admission medications   Medication Sig Start Date End Date Taking? Authorizing Provider  diclofenac Sodium (VOLTAREN) 1 % GEL Apply 2 g topically 4 (four) times daily. 11/22/19   Khatri, Hina, PA-C  promethazine (PHENERGAN) 25 MG tablet Take 1 tablet (25 mg total) by mouth every 6 (six) hours as needed for nausea or vomiting. 05/23/14   Pisciotta, Joni Reining, PA-C  ranitidine (ZANTAC) 150 MG tablet Take 150 mg by mouth 2 (two) times daily as needed for heartburn.    [provider]     Allergies    Patient has no known allergies.  Review of Systems   Review of Systems  Constitutional: Negative for chills and fever.  HENT: Negative for facial swelling.   Eyes: Negative for photophobia, pain, discharge, redness, itching and visual disturbance.    Physical Exam Updated Vital Signs BP (!) 150/93   Pulse 96   Temp 99 F (37.2 C) (Oral)   Resp 16   Ht 5\' 11"  (1.803 m)   Wt 70.3 kg   SpO2 100%   BMI 21.62 kg/m   Physical Exam Vitals reviewed.  Constitutional:      Appearance: Normal appearance.  HENT:     Head: Normocephalic and atraumatic.  Eyes:     General:        Right eye: No discharge.        Left eye: No discharge.     Extraocular Movements: Extraocular movements intact.     Conjunctiva/sclera: Conjunctivae normal.      Comments: Small painless cyst like structure under right eye. Slightly mobile, not warm or overly tender. PERRLA, EOMS intact. Gross vision intact   Musculoskeletal:        General: No swelling. Normal range of motion.  Neurological:     General: No focal deficit  present.     Mental Status: He is alert and oriented to person, place, and time.  Psychiatric:        Mood and Affect: Mood normal.        Behavior: Behavior normal.       ED Results / Procedures / Treatments   Labs (all labs ordered are listed, but only abnormal results are displayed) Labs Reviewed - No data to display  EKG None  Radiology No results found.  Procedures Procedures (including critical care time)  Medications Ordered in ED Medications - No data to display  ED Course  I have reviewed the triage vital signs and the nursing notes.  Pertinent labs & imaging results that were available during my care of the patient were reviewed by me and considered in my medical decision making (see chart for details).    MDM Rules/Calculators/A&P                          34 year old male with complaints of a bump under his eye for a month.  On  exam, the lump is nontender, not warm, slightly mobile.  Appears to be cystic-like structure.  Pupils equal and reactive, EOMs intact.  Gross vision intact and visual acuity is normal. Denies any injury to the eye. DDx includes to lesion versus hordeolum, given the painless nature of the bump, suspect chalazion.  Patient was instructed to apply warm compresses, will provide referral to ophthalmology.  Return precautions discussed.  He voices understanding and is agreeable.  Stable for discharge at this time  Final Clinical Impression(s) / ED Diagnoses Final diagnoses:  Chalazion of right lower eyelid    Rx / DC Orders ED Discharge Orders    None       Mare Ferrari, PA-C 07/06/20 1341    Tegeler, Canary Brim, MD 07/06/20 [phone]

## 2020-07-06 NOTE — ED Triage Notes (Signed)
Pt c/o a painful bump on his right eye that is getting bigger for the past 2 months.

## 2020-09-19 ENCOUNTER — Emergency Department (HOSPITAL_COMMUNITY)
Admission: EM | Admit: 2020-09-19 | Discharge: 2020-09-19 | Disposition: A | Discharge status: Home / Self Care | Payer: Managed Care, Other (non HMO) | Attending: Emergency Medicine | Admitting: Emergency Medicine

## 2020-09-19 ENCOUNTER — Other Ambulatory Visit: Payer: Self-pay

## 2020-09-19 DIAGNOSIS — F17210 Nicotine dependence, cigarettes, uncomplicated: Secondary | ICD-10-CM | POA: Diagnosis not present

## 2020-09-19 DIAGNOSIS — H9390 Unspecified disorder of ear, unspecified ear: Secondary | ICD-10-CM | POA: Diagnosis present

## 2020-09-19 DIAGNOSIS — H6982 Other specified disorders of Eustachian tube, left ear: Secondary | ICD-10-CM

## 2020-09-19 DIAGNOSIS — R0981 Nasal congestion: Secondary | ICD-10-CM | POA: Insufficient documentation

## 2020-09-19 DIAGNOSIS — H6992 Unspecified Eustachian tube disorder, left ear: Secondary | ICD-10-CM | POA: Diagnosis not present

## 2020-09-19 NOTE — Discharge Instructions (Addendum)
It is recommended you stay hydrated, use nasal sprays or rinses, antihistamines to help with your nasal congestion.  This will likely help your ears.  You can also treat your pain with Tylenol or ibuprofen.  Follow-up with your primary care if symptoms persist.

## 2020-09-19 NOTE — ED Triage Notes (Signed)
Pt from home for eval of L ear fullness/muffled hearing when he woke up this morning. He felt a pop in his ear and his hearing is better but now has pain to same.

## 2020-09-19 NOTE — ED Provider Notes (Signed)
[facility] EMERGENCY DEPARTMENT Provider Note   CSN: [id number] Arrival date & time: 09/19/20  0845     History Chief Complaint  Patient presents with  . Ear Fullness    Donald Tate. is a 35 y.o. male presenting to the emergency department with left ear discomfort that began this morning.  He states he woke up this morning and had a fullness/muffled hearing to his left ear.  He states he was talking and when he moves his jaw his left ear pop which relieved the pressure, however he has been having some discomfort throughout the morning in his ear.  He endorses a few days of nasal congestion which he is treating with DayQuil.  No fevers.  The history is provided by the patient.       No past medical history on file.  There are no problems to display for this patient.   Past Surgical History:  Procedure Laterality Date  . HERNIA REPAIR         No family history on file.  Social History   Tobacco Use  . Smoking status: Current Every Day Smoker    Packs/day: 0.50    Types: Cigarettes  . Smokeless tobacco: Never Used  Substance Use Topics  . Alcohol use: Yes    Comment: 6 or more beers a day   . Drug use: Yes    Types: Marijuana    Home Medications Prior to Admission medications   Medication Sig Start Date End Date Taking? Authorizing Provider  diclofenac Sodium (VOLTAREN) 1 % GEL Apply 2 g topically 4 (four) times daily. 11/22/19   Khatri, Hina, PA-C  promethazine (PHENERGAN) 25 MG tablet Take 1 tablet (25 mg total) by mouth every 6 (six) hours as needed for nausea or vomiting. 05/23/14   Pisciotta, Joni Reining, PA-C  ranitidine (ZANTAC) 150 MG tablet Take 150 mg by mouth 2 (two) times daily as needed for heartburn.    [provider]    Allergies    Patient has no known allergies.  Review of Systems   Review of Systems  Constitutional: Negative for fever.  HENT: Positive for ear pain.     Physical Exam Updated Vital Signs BP (!)  132/96 (BP Location: Right Arm)   Pulse 99   Temp 98.5 F (36.9 C) (Oral)   Resp 16   SpO2 99%   Physical Exam Vitals and nursing note reviewed.  Constitutional:      General: He is not in acute distress.    Appearance: He is well-developed.  HENT:     Head: Normocephalic and atraumatic.     Right Ear: Tympanic membrane, ear canal and external ear normal. There is no impacted cerumen.     Left Ear: Tympanic membrane, ear canal and external ear normal. There is no impacted cerumen.     Ears:     Comments: No erythema, effusion, perforation of the TMs Eyes:     Conjunctiva/sclera: Conjunctivae normal.  Cardiovascular:     Rate and Rhythm: Normal rate.  Pulmonary:     Effort: Pulmonary effort is normal.  Neurological:     Mental Status: He is alert.  Psychiatric:        Mood and Affect: Mood normal.        Behavior: Behavior normal.     ED Results / Procedures / Treatments   Labs (all labs ordered are listed, but only abnormal results are displayed) Labs Reviewed - No data to display  EKG None  Radiology No results found.  Procedures Procedures (including critical care time)  Medications Ordered in ED Medications - No data to display  ED Course  I have reviewed the triage vital signs and the nursing notes.  Pertinent labs & imaging results that were available during my care of the patient were reviewed by me and considered in my medical decision making (see chart for details).    MDM Rules/Calculators/A&P                          Patient with left ear pain after waking up with muffled hearing and having his ear pop with movement of his jaw.  He does endorse nasal congestion over the last few days which she has been treating with DayQuil.  No fevers.  Examination of the TMs is normal, no erythema, effusion, or perforation.  Canals are normal.  Suspect eustachian tube dysfunction in the setting of nasal congestion.  Recommend decongestant, Flonase, hydration,  antihistamines.  PCP follow-up as needed.  Discussed results, findings, treatment and follow up. Patient advised of return precautions. Patient verbalized understanding and agreed with plan.  Final Clinical Impression(s) / ED Diagnoses Final diagnoses:  Dysfunction of left eustachian tube  Nasal congestion    Rx / DC Orders ED Discharge Orders    None       Jeree Delcid, [place] N, PA-C 09/19/20 1044    Mancel Bale, MD 09/20/20 1152

## 2020-12-23 ENCOUNTER — Encounter (HOSPITAL_COMMUNITY): Payer: Self-pay | Admitting: Emergency Medicine

## 2020-12-23 ENCOUNTER — Other Ambulatory Visit: Payer: Self-pay

## 2020-12-23 ENCOUNTER — Ambulatory Visit (HOSPITAL_COMMUNITY)
Admission: EM | Admit: 2020-12-23 | Discharge: 2020-12-23 | Disposition: A | Discharge status: Home / Self Care | Payer: Managed Care, Other (non HMO) | Attending: Medical Oncology | Admitting: Medical Oncology

## 2020-12-23 DIAGNOSIS — L989 Disorder of the skin and subcutaneous tissue, unspecified: Secondary | ICD-10-CM

## 2020-12-23 MED ORDER — DOXYCYCLINE HYCLATE 100 MG PO CAPS: 100.0000 mg | ORAL_CAPSULE | Freq: Two times a day (BID) | ORAL | 0 refills | Status: DC

## 2020-12-23 NOTE — ED Triage Notes (Signed)
Noticed "bump" under right eye a few months ago, was seen in ED, bump then started improving without treatment. Patient struck this area earlier this week and size has increased significantly.

## 2020-12-23 NOTE — Discharge Instructions (Addendum)
Please set up a visit with dermatology for follow up and potential biopsy

## 2020-12-23 NOTE — ED Provider Notes (Signed)
[facility]    CSN: [id number] Arrival date & time: 12/23/20  1004      History   Chief Complaint Chief Complaint  Patient presents with  . Abscess    Bump under right eye, size has increased significantly in last few days    HPI Donald Tate. is a 35 y.o. male.   HPI   Abscess: Pt reports that he has had a bump under his right eye for the past few months. He was seen in the ED a few months ago and was treated with ABX that helped the area however recently this area was hit by a box. Since this time the area has grown in size. No discharge, erythema, pain of eye. No fevers or flu like symptoms.   History reviewed. No pertinent past medical history.  There are no problems to display for this patient.   Past Surgical History:  Procedure Laterality Date  . HERNIA REPAIR         Home Medications    Prior to Admission medications   Medication Sig Start Date End Date Taking? Authorizing Provider  diclofenac Sodium (VOLTAREN) 1 % GEL Apply 2 g topically 4 (four) times daily. 11/22/19   Khatri, Hina, PA-C  promethazine (PHENERGAN) 25 MG tablet Take 1 tablet (25 mg total) by mouth every 6 (six) hours as needed for nausea or vomiting. 05/23/14   Pisciotta, Joni Reining, PA-C  ranitidine (ZANTAC) 150 MG tablet Take 150 mg by mouth 2 (two) times daily as needed for heartburn.    [provider]    Family History History reviewed. No pertinent family history.  Social History Social History   Tobacco Use  . Smoking status: Current Every Day Smoker    Packs/day: 0.50    Types: Cigarettes  . Smokeless tobacco: Never Used  Substance Use Topics  . Alcohol use: Yes    Comment: 6 or more beers a day   . Drug use: Yes    Types: Marijuana     Allergies   Patient has no known allergies.   Review of Systems Review of Systems  As stated above in HPI Physical Exam Triage Vital Signs ED Triage Vitals  Enc Vitals Group     BP 12/23/20 1013 133/87      Pulse Rate 12/23/20 1013 97     Resp 12/23/20 1013 16     Temp 12/23/20 1013 99.3 F (37.4 C)     Temp Source 12/23/20 1013 Oral     SpO2 12/23/20 1013 97 %     Weight --      Height --      Head Circumference --      Peak Flow --      Pain Score 12/23/20 1012 2     Pain Loc --      Pain Edu? --      Excl. in GC? --    No data found.  Updated Vital Signs BP 133/87   Pulse 97   Temp 99.3 F (37.4 C) (Oral)   Resp 16   SpO2 97%   Physical Exam Vitals and nursing note reviewed.  Constitutional:      General: He is not in acute distress.    Appearance: Normal appearance. He is not ill-appearing, toxic-appearing or diaphoretic.  Skin:    Comments: There is a grape sized skin colored pedunculated mass of the right upper check near lower eyelid with many telangiectasias.   Neurological:  Mental Status: He is alert.      UC Treatments / Results  Labs (all labs ordered are listed, but only abnormal results are displayed) Labs Reviewed - No data to display  EKG   Radiology No results found.  Procedures Procedures (including critical care time)  Medications Ordered in UC Medications - No data to display  Initial Impression / Assessment and Plan / UC Course  I have reviewed the triage vital signs and the nursing notes.  Pertinent labs & imaging results that were available during my care of the patient were reviewed by me and considered in my medical decision making (see chart for details).     New to me. Could be infectious but also discussed worry for SCC vs BCC. Discussed with patient who is agreeable to dermatology follow up which he will set up on Monday. For now sending in doxycyline for him to start. Discussed.  Final Clinical Impressions(s) / UC Diagnoses   Final diagnoses:  None   Discharge Instructions   None    ED Prescriptions    None     PDMP not reviewed this encounter.   Rushie Chestnut, [place] 12/23/20 1028

## 2021-05-29 ENCOUNTER — Encounter (HOSPITAL_COMMUNITY): Payer: Self-pay

## 2021-05-29 ENCOUNTER — Other Ambulatory Visit: Payer: Self-pay

## 2021-05-29 ENCOUNTER — Ambulatory Visit (HOSPITAL_COMMUNITY)
Admission: EM | Admit: 2021-05-29 | Discharge: 2021-05-29 | Disposition: A | Discharge status: Home / Self Care | Payer: Managed Care, Other (non HMO) | Attending: Emergency Medicine | Admitting: Emergency Medicine

## 2021-05-29 DIAGNOSIS — J0110 Acute frontal sinusitis, unspecified: Secondary | ICD-10-CM | POA: Diagnosis not present

## 2021-05-29 DIAGNOSIS — J069 Acute upper respiratory infection, unspecified: Secondary | ICD-10-CM | POA: Diagnosis not present

## 2021-05-29 DIAGNOSIS — R059 Cough, unspecified: Secondary | ICD-10-CM

## 2021-05-29 MED ORDER — PREDNISONE 10 MG (21) PO TBPK: ORAL_TABLET | Freq: Every day | ORAL | 0 refills | Status: DC

## 2021-05-29 MED ORDER — LEVOCETIRIZINE DIHYDROCHLORIDE 5 MG PO TABS
5.0000 mg | ORAL_TABLET | Freq: Every evening | ORAL | 1 refills | Status: DC
Start: 2021-05-29 — End: 2023-06-14

## 2021-05-29 MED ORDER — ALBUTEROL SULFATE HFA 108 (90 BASE) MCG/ACT IN AERS
1.0000 | INHALATION_SPRAY | Freq: Four times a day (QID) | RESPIRATORY_TRACT | 0 refills | Status: DC | PRN
Start: 2021-05-29 — End: 2023-06-14

## 2021-05-29 NOTE — ED Provider Notes (Signed)
[facility]    CSN: [id number] Arrival date & time: 05/29/21  0801      History   Chief Complaint Chief Complaint  Patient presents with   Cough    HPI Sol Englert. is a 35 y.o. male.   Pt has cough, congestion, sinus pressure for the past 2 days now. Denies any fever, no n/v/d. Took motrin with no relief    History reviewed. No pertinent past medical history.  There are no problems to display for this patient.   Past Surgical History:  Procedure Laterality Date   HERNIA REPAIR         Home Medications    Prior to Admission medications   Medication Sig Start Date End Date Taking? Authorizing Provider  albuterol (VENTOLIN HFA) 108 (90 Base) MCG/ACT inhaler Inhale 1-2 puffs into the lungs every 6 (six) hours as needed for wheezing or shortness of breath. 05/29/21  Yes Coralyn Mark, NP  levocetirizine (XYZAL) 5 MG tablet Take 1 tablet (5 mg total) by mouth every evening. 05/29/21  Yes Coralyn Mark, NP  predniSONE (STERAPRED UNI-PAK 21 TAB) 10 MG (21) TBPK tablet Take by mouth daily. Take 6 tabs by mouth daily  for 2 days, then 5 tabs for 2 days, then 4 tabs for 2 days, then 3 tabs for 2 days, 2 tabs for 2 days, then 1 tab by mouth daily for 2 days 05/29/21  Yes Coralyn Mark, NP    Family History History reviewed. No pertinent family history.  Social History Social History   Tobacco Use   Smoking status: Every Day    Packs/day: 0.50    Types: Cigarettes   Smokeless tobacco: Never  Substance Use Topics   Alcohol use: Yes    Comment: 6 or more beers a day    Drug use: Yes    Types: Marijuana     Allergies   Patient has no known allergies.   Review of Systems Review of Systems  Constitutional:  Negative for fever.  HENT:  Positive for congestion, postnasal drip, rhinorrhea, sinus pressure, sinus pain and sneezing. Negative for ear discharge and ear pain.   Respiratory:  Positive for cough. Negative for shortness of  breath.   Cardiovascular: Negative.   Gastrointestinal: Negative.   Neurological: Negative.     Physical Exam Triage Vital Signs ED Triage Vitals [05/29/21 0820]  Enc Vitals Group     BP (!) 145/105     Pulse Rate 94     Resp 18     Temp 99.5 F (37.5 C)     Temp Source Oral     SpO2 95 %     Weight      Height      Head Circumference      Peak Flow      Pain Score 2     Pain Loc      Pain Edu?      Excl. in GC?    No data found.  Updated Vital Signs BP (!) 145/105 (BP Location: Right Arm)   Pulse 94   Temp 99.5 F (37.5 C) (Oral)   Resp 18   SpO2 95%   Visual Acuity Right Eye Distance:   Left Eye Distance:   Bilateral Distance:    Right Eye Near:   Left Eye Near:    Bilateral Near:     Physical Exam Constitutional:      Appearance: Normal appearance.  HENT:  Right Ear: Tympanic membrane normal.     Left Ear: Tympanic membrane normal.     Nose: Congestion and rhinorrhea present.     Mouth/Throat:     Mouth: Mucous membranes are moist.     Pharynx: Posterior oropharyngeal erythema present.  Eyes:     Pupils: Pupils are equal, round, and reactive to light.  Cardiovascular:     Rate and Rhythm: Normal rate.  Pulmonary:     Effort: Pulmonary effort is normal.     Breath sounds: Normal breath sounds.  Abdominal:     General: Abdomen is flat.  Skin:    General: Skin is warm.  Neurological:     General: No focal deficit present.     Mental Status: He is alert.     UC Treatments / Results  Labs (all labs ordered are listed, but only abnormal results are displayed) Labs Reviewed - No data to display  EKG   Radiology No results found.  Procedures Procedures (including critical care time)  Medications Ordered in UC Medications - No data to display  Initial Impression / Assessment and Plan / UC Course  I have reviewed the triage vital signs and the nursing notes.  Pertinent labs & imaging results that were available during my care of  the patient were reviewed by me and considered in my medical decision making (see chart for details).     Take otc cough meds as need  Cough be viral illness or sinus allergies.  Cont to take tylenol or motrin as needed  Stay hydrated well   Final Clinical Impressions(s) / UC Diagnoses   Final diagnoses:  Cough  Viral URI with cough  Acute non-recurrent frontal sinusitis   Discharge Instructions   None    ED Prescriptions     Medication Sig Dispense Auth. Provider   predniSONE (STERAPRED UNI-PAK 21 TAB) 10 MG (21) TBPK tablet Take by mouth daily. Take 6 tabs by mouth daily  for 2 days, then 5 tabs for 2 days, then 4 tabs for 2 days, then 3 tabs for 2 days, 2 tabs for 2 days, then 1 tab by mouth daily for 2 days 42 tablet Maple Mirza L, NP   albuterol (VENTOLIN HFA) 108 (90 Base) MCG/ACT inhaler Inhale 1-2 puffs into the lungs every 6 (six) hours as needed for wheezing or shortness of breath. 90 g Maple Mirza L, NP   levocetirizine (XYZAL) 5 MG tablet Take 1 tablet (5 mg total) by mouth every evening. 30 tablet Coralyn Mark, NP      PDMP not reviewed this encounter.   Coralyn Mark, NP 05/29/21 [phone]

## 2021-05-29 NOTE — ED Triage Notes (Addendum)
Pt c/o cough, sneezing, nasal congestion, and body aches since yesterday. Pt requesting a work note.

## 2021-06-13 ENCOUNTER — Encounter (HOSPITAL_COMMUNITY): Payer: Self-pay | Admitting: Emergency Medicine

## 2021-06-13 ENCOUNTER — Other Ambulatory Visit: Payer: Self-pay

## 2021-06-13 ENCOUNTER — Ambulatory Visit (HOSPITAL_COMMUNITY)
Admission: EM | Admit: 2021-06-13 | Discharge: 2021-06-13 | Disposition: A | Discharge status: Home / Self Care | Payer: Managed Care, Other (non HMO)

## 2021-06-13 DIAGNOSIS — B349 Viral infection, unspecified: Secondary | ICD-10-CM

## 2021-06-13 NOTE — ED Provider Notes (Signed)
[facility]    CSN: [id number] Arrival date & time: 06/13/21  0802      History   Chief Complaint Chief Complaint  Patient presents with  . Fever  . Sore Throat  . Generalized Body Aches    HPI Donald Tate. is a 35 y.o. male.   Patient presents with intermittent fevers reduced with medication, intermittent generalized headaches, sensation of head fullness, body aches, chills, sore throat, nasal congestion, rhinorrhea for 1 week.  Tolerating food and liquids but appetite has decreased.  All members of household were sick approximately 1 month ago, patient seen in urgent care on 9/21/ 22, unable to afford medications prescribed, used over-the-counter medications instead which did provide some relief.  1 child who recently started school became sick again and patient began having reoccurring symptoms shortly after.  Denies ear pain, chest pain, shortness of breath, wheezing, abdominal pain, nausea, vomiting, diarrhea.  Fever Sore Throat  History reviewed. No pertinent past medical history.  There are no problems to display for this patient.   Past Surgical History:  Procedure Laterality Date  . HERNIA REPAIR         Home Medications    Prior to Admission medications   Medication Sig Start Date End Date Taking? Authorizing Provider  albuterol (VENTOLIN HFA) 108 (90 Base) MCG/ACT inhaler Inhale 1-2 puffs into the lungs every 6 (six) hours as needed for wheezing or shortness of breath. 05/29/21   Coralyn Mark, NP  levocetirizine (XYZAL) 5 MG tablet Take 1 tablet (5 mg total) by mouth every evening. 05/29/21   Coralyn Mark, NP  predniSONE (STERAPRED UNI-PAK 21 TAB) 10 MG (21) TBPK tablet Take by mouth daily. Take 6 tabs by mouth daily  for 2 days, then 5 tabs for 2 days, then 4 tabs for 2 days, then 3 tabs for 2 days, 2 tabs for 2 days, then 1 tab by mouth daily for 2 days 05/29/21   Coralyn Mark, NP    Family History History reviewed. No  pertinent family history.  Social History Social History   Tobacco Use  . Smoking status: Every Day    Packs/day: 0.50    Types: Cigarettes  . Smokeless tobacco: Never  Vaping Use  . Vaping Use: Never used  Substance Use Topics  . Alcohol use: Yes    Comment: 6 or more beers a day   . Drug use: Yes    Types: Marijuana     Allergies   Patient has no known allergies.   Review of Systems Review of Systems Defer to HPI    Physical Exam Triage Vital Signs ED Triage Vitals  Enc Vitals Group     BP 06/13/21 0818 (!) 145/89     Pulse Rate 06/13/21 0818 (!) 107     Resp --      Temp 06/13/21 0818 99 F (37.2 C)     Temp Source 06/13/21 0818 Oral     SpO2 06/13/21 0818 95 %     Weight --      Height --      Head Circumference --      Peak Flow --      Pain Score 06/13/21 0820 5     Pain Loc --      Pain Edu? --      Excl. in GC? --    No data found.  Updated Vital Signs BP (!) 145/89 (BP Location: Right Arm)   Pulse Marland Kitchen)  107   Temp 99 F (37.2 C) (Oral)   SpO2 95%   Visual Acuity Right Eye Distance:   Left Eye Distance:   Bilateral Distance:    Right Eye Near:   Left Eye Near:    Bilateral Near:     Physical Exam Constitutional:      Appearance: Normal appearance. He is normal weight.  HENT:     Head: Normocephalic.     Right Ear: Tympanic membrane, ear canal and external ear normal.     Left Ear: Tympanic membrane, ear canal and external ear normal.     Nose: Congestion and rhinorrhea present.     Mouth/Throat:     Mouth: Mucous membranes are moist.     Pharynx: Posterior oropharyngeal erythema present.  Eyes:     Extraocular Movements: Extraocular movements intact.  Cardiovascular:     Rate and Rhythm: Normal rate and regular rhythm.     Pulses: Normal pulses.     Heart sounds: Normal heart sounds.  Pulmonary:     Effort: Pulmonary effort is normal.     Breath sounds: Normal breath sounds.  Musculoskeletal:     Cervical back: Normal range  of motion and neck supple.  Skin:    General: Skin is warm and dry.  Neurological:     General: No focal deficit present.     Mental Status: He is alert and oriented to person, place, and time. Mental status is at baseline.  Psychiatric:        Mood and Affect: Mood normal.        Behavior: Behavior normal.     UC Treatments / Results  Labs (all labs ordered are listed, but only abnormal results are displayed) Labs Reviewed - No data to display  EKG   Radiology No results found.  Procedures Procedures (including critical care time)  Medications Ordered in UC Medications - No data to display  Initial Impression / Assessment and Plan / UC Course  I have reviewed the triage vital signs and the nursing notes.  Pertinent labs & imaging results that were available during my care of the patient were reviewed by me and considered in my medical decision making (see chart for details).  Viral illness  Discussed etiology of symptoms with patient, discussed that exposure is most likely coming from childhood just started school, given list of over-the-counter medications to manage symptoms at home, advised to follow-up with urgent care as needed, given work note for 1 day Final Clinical Impressions(s) / UC Diagnoses   Final diagnoses:  Viral illness     Discharge Instructions      For congestion: take a daily anti-histamine like Zyrtec, Claritin, and a oral decongestant, such as pseudoephedrine.  You can also use Flonase 1-2 sprays in each nostril daily.  For cough: honey 1/2 to 1 teaspoon (you can dilute the honey in water or another fluid).  You can also use guaifenesin and dextromethorphan for cough. You can use a humidifier for chest congestion and cough.  If you don't have a humidifier, you can sit in the bathroom with the hot shower running.       You can take Tylenol and/or Ibuprofen as needed for fever reduction and pain relief.      For sore throat: try warm salt  water gargles, cepacol lozenges, throat spray, warm tea or water with lemon/honey, popsicles or ice, or OTC cold relief medicine for throat discomfort.   It is important to stay hydrated: drink  plenty of fluids (water, gatorade/powerade/pedialyte, juices, or teas) to keep your throat moisturized and help further relieve irritation/discomfort.    Maintaining adequate hydration may help to thin secretions and soothe the respiratory mucosa   Warm Liquids- Ingestion of warm liquids may have a soothing effect on the respiratory mucosa, increase the flow of nasal mucus, and loosen respiratory secretions, making them easier to remove  May try honey (2.5 to 5 mL [0.5 to 1 teaspoon]) can be given straight or diluted in liquid (juice). Corn syrup may be substituted if honey is not available.          ED Prescriptions   None    PDMP not reviewed this encounter.   Valinda Hoar, [place] 06/13/21 [phone]

## 2021-06-13 NOTE — ED Triage Notes (Signed)
Pt was seen here on 05/29/21 for similar sxs. He has a fever, body aches, and cough. The rx he was given at his last visit was not taken due to the cost.

## 2021-06-13 NOTE — Discharge Instructions (Addendum)
For congestion: take a daily anti-histamine like Zyrtec, Claritin, and a oral decongestant, such as pseudoephedrine.  You can also use Flonase 1-2 sprays in each nostril daily.  For cough: honey 1/2 to 1 teaspoon (you can dilute the honey in water or another fluid).  You can also use guaifenesin and dextromethorphan for cough. You can use a humidifier for chest congestion and cough.  If you don't have a humidifier, you can sit in the bathroom with the hot shower running.       You can take Tylenol and/or Ibuprofen as needed for fever reduction and pain relief.      For sore throat: try warm salt water gargles, cepacol lozenges, throat spray, warm tea or water with lemon/honey, popsicles or ice, or OTC cold relief medicine for throat discomfort.   It is important to stay hydrated: drink plenty of fluids (water, gatorade/powerade/pedialyte, juices, or teas) to keep your throat moisturized and help further relieve irritation/discomfort.    Maintaining adequate hydration may help to thin secretions and soothe the respiratory mucosa   Warm Liquids- Ingestion of warm liquids may have a soothing effect on the respiratory mucosa, increase the flow of nasal mucus, and loosen respiratory secretions, making them easier to remove  May try honey (2.5 to 5 mL [0.5 to 1 teaspoon]) can be given straight or diluted in liquid (juice). Corn syrup may be substituted if honey is not available.

## 2021-07-26 ENCOUNTER — Encounter (HOSPITAL_COMMUNITY): Payer: Self-pay | Admitting: Emergency Medicine

## 2021-07-26 ENCOUNTER — Other Ambulatory Visit: Payer: Self-pay

## 2021-07-26 ENCOUNTER — Ambulatory Visit (HOSPITAL_COMMUNITY)
Admission: EM | Admit: 2021-07-26 | Discharge: 2021-07-26 | Disposition: A | Discharge status: Home / Self Care | Payer: Managed Care, Other (non HMO) | Attending: Physician Assistant | Admitting: Physician Assistant

## 2021-07-26 DIAGNOSIS — L84 Corns and callosities: Secondary | ICD-10-CM

## 2021-07-26 MED ORDER — SALICYLIC ACID 17 % EX GEL: Freq: Every day | CUTANEOUS | 0 refills | Status: DC

## 2021-07-26 NOTE — ED Triage Notes (Signed)
Pt presents with right foot pain. States has what seems to be a hole in his foot. C/o of burning sensation.

## 2021-07-26 NOTE — Discharge Instructions (Signed)
Apply salicylic acid at night.  Keep area clean.  Follow-up with podiatrist as we discussed.  If you have any signs of infection please return for reevaluation.

## 2021-07-26 NOTE — ED Provider Notes (Signed)
[facility]    CSN: [id number] Arrival date & time: 07/26/21  6294      History   Chief Complaint Chief Complaint  Patient presents with   Foot Pain    HPI Donald Tate. is a 35 y.o. male.   Patient presents today with a several month history of enlarging lesion on the plantar surface of his right foot.  He has been trimming area down with a blade but continues to have spread of lesion and has become more painful.  Denies any change to full clear prior to symptom onset.  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  He denies any history of diabetes or peripheral neuropathy.  He has not tried any topical medications.  He reports pain is severe when attempted ambulation and rated 8/10 on a 0-10 pain scale, localized to affected area, described as sharp, no aggravating relieving factors identified.  He has missed work as result of symptoms.  He has not seen a podiatrist in the past.   History reviewed. No pertinent past medical history.  There are no problems to display for this patient.   Past Surgical History:  Procedure Laterality Date   HERNIA REPAIR         Home Medications    Prior to Admission medications   Medication Sig Start Date End Date Taking? Authorizing Provider  salicylic acid 17 % gel Apply topically daily. 07/26/21  Yes Karelly Dewalt K, PA-C  albuterol (VENTOLIN HFA) 108 (90 Base) MCG/ACT inhaler Inhale 1-2 puffs into the lungs every 6 (six) hours as needed for wheezing or shortness of breath. 05/29/21   Coralyn Mark, NP  levocetirizine (XYZAL) 5 MG tablet Take 1 tablet (5 mg total) by mouth every evening. 05/29/21   Coralyn Mark, NP  predniSONE (STERAPRED UNI-PAK 21 TAB) 10 MG (21) TBPK tablet Take by mouth daily. Take 6 tabs by mouth daily  for 2 days, then 5 tabs for 2 days, then 4 tabs for 2 days, then 3 tabs for 2 days, 2 tabs for 2 days, then 1 tab by mouth  daily for 2 days 05/29/21   Coralyn Mark, NP    Family History History reviewed. No pertinent family history.  Social History Social History   Tobacco Use   Smoking status: Every Day    Packs/day: 0.50    Types: Cigarettes   Smokeless tobacco: Never  Vaping Use   Vaping Use: Never used  Substance Use Topics   Alcohol use: Yes    Comment: 6 or more beers a day    Drug use: Yes    Types: Marijuana     Allergies   Patient has no known allergies.   Review of Systems Review of Systems  Constitutional:  Positive for activity change. Negative for appetite change, fatigue and fever.  Respiratory:  Negative for cough and shortness of breath.   Cardiovascular:  Negative for chest pain.  Gastrointestinal:  Negative for abdominal pain, diarrhea, nausea and vomiting.  Skin:  Positive for wound. Negative for color change.  Neurological:  Negative for dizziness, weakness, light-headedness, numbness and headaches.    Physical Exam Triage Vital Signs ED Triage Vitals  Enc Vitals Group     BP 07/26/21 0821 130/89     Pulse Rate 07/26/21 0821 72     Resp 07/26/21 0821 16     Temp 07/26/21 0821 98.4 F (  36.9 C)     Temp Source 07/26/21 0821 Oral     SpO2 07/26/21 0821 97 %     Weight --      Height --      Head Circumference --      Peak Flow --      Pain Score 07/26/21 0820 2     Pain Loc --      Pain Edu? --      Excl. in GC? --    No data found.  Updated Vital Signs BP 130/89 (BP Location: Right Arm)   Pulse 72   Temp 98.4 F (36.9 C) (Oral)   Resp 16   SpO2 97%   Visual Acuity Right Eye Distance:   Left Eye Distance:   Bilateral Distance:    Right Eye Near:   Left Eye Near:    Bilateral Near:     Physical Exam Vitals reviewed.  Constitutional:      General: He is awake.     Appearance: Normal appearance. He is well-developed. He is not ill-appearing.     Comments: Very pleasant male appears stated age in no acute distress sitting comfortably in  exam room  HENT:     Head: Normocephalic and atraumatic.  Cardiovascular:     Rate and Rhythm: Normal rate and regular rhythm.     Heart sounds: Normal heart sounds, S1 normal and S2 normal. No murmur heard. Pulmonary:     Effort: Pulmonary effort is normal.     Breath sounds: Normal breath sounds. No stridor. No wheezing, rhonchi or rales.     Comments: Clear to auscultation bilaterally Musculoskeletal:       Feet:  Feet:     Right foot:     Protective Sensation: 10 sites tested.  10 sites sensed.     Skin integrity: Callus present.     Comments: 4 cm x 3 cm callus noted plantar right foot. Neurological:     Mental Status: He is alert.  Psychiatric:        Behavior: Behavior is cooperative.      UC Treatments / Results  Labs (all labs ordered are listed, but only abnormal results are displayed) Labs Reviewed - No data to display  EKG   Radiology No results found.  Procedures Procedures (including critical care time)  Medications Ordered in UC Medications - No data to display  Initial Impression / Assessment and Plan / UC Course  I have reviewed the triage vital signs and the nursing notes.  Pertinent labs & imaging results that were available during my care of the patient were reviewed by me and considered in my medical decision making (see chart for details).     No evidence of secondary infection that warrant initiation of antibiotics.  We will treat with salicylic gel at night.  Discussed that likely he will need to see a podiatrist for further evaluation and management was given contact information for local provider.  Recommend he wear supportive and well fitting shoes.  Discussed alarm symptoms that warrant emergent evaluation.  Strict return precautions given to which she expressed understanding.  Final Clinical Impressions(s) / UC Diagnoses   Final diagnoses:  Callus of foot     Discharge Instructions      Apply salicylic acid at night.  Keep  area clean.  Follow-up with podiatrist as we discussed.  If you have any signs of infection please return for reevaluation.     ED Prescriptions  Medication Sig Dispense Auth. Provider   salicylic acid 17 % gel Apply topically daily. 30 g Jacilyn Sanpedro, Noberto Retort, PA-C      PDMP not reviewed this encounter.   Jeani Hawking, PA-C 07/26/21 [id number]

## 2021-08-27 ENCOUNTER — Ambulatory Visit (HOSPITAL_COMMUNITY)
Admission: EM | Admit: 2021-08-27 | Discharge: 2021-08-27 | Disposition: A | Discharge status: Home / Self Care | Payer: Managed Care, Other (non HMO) | Attending: Family Medicine | Admitting: Family Medicine

## 2021-08-27 ENCOUNTER — Other Ambulatory Visit: Payer: Self-pay

## 2021-08-27 ENCOUNTER — Encounter (HOSPITAL_COMMUNITY): Payer: Self-pay | Admitting: Emergency Medicine

## 2021-08-27 DIAGNOSIS — M25512 Pain in left shoulder: Secondary | ICD-10-CM

## 2021-08-27 MED ORDER — TIZANIDINE HCL 4 MG PO TABS: 4.0000 mg | ORAL_TABLET | Freq: Three times a day (TID) | ORAL | 0 refills | Status: DC | PRN

## 2021-08-27 MED ORDER — IBUPROFEN 800 MG PO TABS: 800.0000 mg | ORAL_TABLET | Freq: Three times a day (TID) | ORAL | 0 refills | Status: DC | PRN

## 2021-08-27 NOTE — ED Triage Notes (Signed)
Pt is present today with left shoulder pain. Pt states that he noticed the pain yesterday at work. Pt denies any injury but states that he does a lot of heavy lifting at work

## 2021-08-27 NOTE — ED Provider Notes (Signed)
[facility]    CSN: [id number] Arrival date & time: 08/27/21  6270      History   Chief Complaint Chief Complaint  Patient presents with   Shoulder Pain    HPI Donald Tate. is a 35 y.o. male.    Shoulder Pain Here with a history of left shoulder and left upper back pain since overnight.  He has applied some heat this morning and it is feeling just a little bit better.  He did have some tingling in his forearm this morning that has now improved.  No muscle weakness.  He has had no trauma or fall.  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  He reports no drug allergies.  No fever or chills.  No cough or other upper respiratory symptoms.  No shingles rash or itching  History reviewed. No pertinent past medical history.  There are no problems to display for this patient.   Past Surgical History:  Procedure Laterality Date   HERNIA REPAIR         Home Medications    Prior to Admission medications   Medication Sig Start Date End Date Taking? Authorizing Provider  ibuprofen (ADVIL) 800 MG tablet Take 1 tablet (800 mg total) by mouth every 8 (eight) hours as needed. 08/27/21  Yes Zenia Resides, MD  tiZANidine (ZANAFLEX) 4 MG tablet Take 1 tablet (4 mg total) by mouth every 8 (eight) hours as needed for muscle spasms. 08/27/21  Yes Zenia Resides, MD  albuterol (VENTOLIN HFA) 108 (90 Base) MCG/ACT inhaler Inhale 1-2 puffs into the lungs every 6 (six) hours as needed for wheezing or shortness of breath. 05/29/21   Coralyn Mark, NP  levocetirizine (XYZAL) 5 MG tablet Take 1 tablet (5 mg total) by mouth every evening. 05/29/21   Coralyn Mark, NP  predniSONE (STERAPRED UNI-PAK 21 TAB) 10 MG (21) TBPK tablet Take by mouth daily. Take 6 tabs by mouth daily  for 2 days, then 5 tabs for 2 days, then 4 tabs for 2 days, then 3 tabs for 2 days, 2 tabs for 2 days, then 1 tab by mouth daily for 2  days 05/29/21   Coralyn Mark, NP  salicylic acid 17 % gel Apply topically daily. 07/26/21   Raspet, Noberto Retort, PA-C    Family History History reviewed. No pertinent family history.  Social History Social History   Tobacco Use   Smoking status: Every Day    Packs/day: 0.50    Types: Cigarettes   Smokeless tobacco: Never  Vaping Use   Vaping Use: Never used  Substance Use Topics   Alcohol use: Yes    Comment: 6 or more beers a day    Drug use: Yes    Types: Marijuana     Allergies   Patient has no known allergies.   Review of Systems Review of Systems   Physical Exam Triage Vital Signs ED Triage Vitals [08/27/21 0820]  Enc Vitals Group     BP (!) 149/96     Pulse Rate 82     Resp 17     Temp 98.3 F (36.8 C)     Temp Source Oral     SpO2 97 %     Weight      Height      Head Circumference      Peak Flow      Pain Score 3  Pain Loc      Pain Edu?      Excl. in GC?    No data found.  Updated Vital Signs BP (!) 149/96 (BP Location: Right Arm)    Pulse 82    Temp 98.3 F (36.8 C) (Oral)    Resp 17    SpO2 97%   Visual Acuity Right Eye Distance:   Left Eye Distance:   Bilateral Distance:    Right Eye Near:   Left Eye Near:    Bilateral Near:     Physical Exam Vitals reviewed.  Constitutional:      General: He is not in acute distress.    Appearance: He is not toxic-appearing.  Cardiovascular:     Rate and Rhythm: Normal rate and regular rhythm.     Heart sounds: No murmur heard. Pulmonary:     Effort: Pulmonary effort is normal.     Breath sounds: Normal breath sounds.  Musculoskeletal:     Cervical back: Neck supple.     Comments: Left trapezius is tender over posterior aspect of shoulder. He is a little tender anteriorly. No tenderness of neck.  Grip 5/5. ROM about the left shoulder is limited by pain  Lymphadenopathy:     Cervical: No cervical adenopathy.  Skin:    Coloration: Skin is not pale.     Findings: No rash (no  herpetiform rash).  Neurological:     Mental Status: He is alert.     UC Treatments / Results  Labs (all labs ordered are listed, but only abnormal results are displayed) Labs Reviewed - No data to display  EKG   Radiology No results found.  Procedures Procedures (including critical care time)  Medications Ordered in UC Medications - No data to display  Initial Impression / Assessment and Plan / UC Course  I have reviewed the triage vital signs and the nursing notes.  Pertinent labs & imaging results that were available during my care of the patient were reviewed by me and considered in my medical decision making (see chart for details).     With no trauma, will not xray today. Treat for muscle strain with rest, NSAIDs, muscle relaxer, and local heat. Final Clinical Impressions(s) / UC Diagnoses   Final diagnoses:  Acute pain of left shoulder     Discharge Instructions      Take ibuprofen 800 mg 3 times daily as needed for pain. Take tizanidine 4 mg every 8 hours as needed for muscle pain/spasm.   Use warm compresses or heating pad to the sore area.     ED Prescriptions     Medication Sig Dispense Auth. Provider   ibuprofen (ADVIL) 800 MG tablet Take 1 tablet (800 mg total) by mouth every 8 (eight) hours as needed. 21 tablet Theotis Gerdeman, Janace Aris, MD   tiZANidine (ZANAFLEX) 4 MG tablet Take 1 tablet (4 mg total) by mouth every 8 (eight) hours as needed for muscle spasms. 30 tablet Karesha Trzcinski, Janace Aris, MD      PDMP not reviewed this encounter.   Zenia Resides, MD 08/27/21 [phone]

## 2021-08-27 NOTE — Discharge Instructions (Addendum)
Take ibuprofen 800 mg 3 times daily as needed for pain. Take tizanidine 4 mg every 8 hours as needed for muscle pain/spasm.   Use warm compresses or heating pad to the sore area.

## 2021-08-30 ENCOUNTER — Ambulatory Visit (HOSPITAL_COMMUNITY)
Admission: EM | Admit: 2021-08-30 | Discharge: 2021-08-30 | Disposition: A | Discharge status: Home / Self Care | Payer: Managed Care, Other (non HMO) | Attending: Family Medicine | Admitting: Family Medicine

## 2021-08-30 ENCOUNTER — Encounter (HOSPITAL_COMMUNITY): Payer: Self-pay

## 2021-08-30 ENCOUNTER — Other Ambulatory Visit: Payer: Self-pay

## 2021-08-30 DIAGNOSIS — M25512 Pain in left shoulder: Secondary | ICD-10-CM | POA: Diagnosis not present

## 2021-08-30 MED ORDER — TIZANIDINE HCL 4 MG PO TABS: 4.0000 mg | ORAL_TABLET | Freq: Three times a day (TID) | ORAL | 0 refills | Status: DC | PRN

## 2021-08-30 NOTE — ED Provider Notes (Signed)
[facility]    CSN: [id number] Arrival date & time: 08/30/21  0801      History   Chief Complaint Chief Complaint  Patient presents with   Shoulder Pain    HPI Donald Tate. is a 35 y.o. male.    Shoulder Pain Here for continued trouble with left shoulder. He was seen here on 12/20 for posterior left shoulder pain, worse with movement. He says the muscle relaxer was too pricey to pick up, so took [employer], which helped. Returned to work yesterday, and the pain overall is improved, though it is burning now in that area. No new rash (has psoriasis already). No fever or precordial pain. Feels he just needs some more rest from work/lifting.   History reviewed. No pertinent past medical history.  There are no problems to display for this patient.   Past Surgical History:  Procedure Laterality Date   HERNIA REPAIR         Home Medications    Prior to Admission medications   Medication Sig Start Date End Date Taking? Authorizing Provider  albuterol (VENTOLIN HFA) 108 (90 Base) MCG/ACT inhaler Inhale 1-2 puffs into the lungs every 6 (six) hours as needed for wheezing or shortness of breath. 05/29/21   Coralyn Mark, NP  ibuprofen (ADVIL) 800 MG tablet Take 1 tablet (800 mg total) by mouth every 8 (eight) hours as needed. 08/27/21   Zenia Resides, MD  levocetirizine (XYZAL) 5 MG tablet Take 1 tablet (5 mg total) by mouth every evening. 05/29/21   Coralyn Mark, NP  predniSONE (STERAPRED UNI-PAK 21 TAB) 10 MG (21) TBPK tablet Take by mouth daily. Take 6 tabs by mouth daily  for 2 days, then 5 tabs for 2 days, then 4 tabs for 2 days, then 3 tabs for 2 days, 2 tabs for 2 days, then 1 tab by mouth daily for 2 days 05/29/21   Coralyn Mark, NP  salicylic acid 17 % gel Apply topically daily. 07/26/21   Raspet, Noberto Retort, PA-C  tiZANidine (ZANAFLEX) 4 MG tablet Take 1 tablet (4 mg total) by mouth every 8 (eight) hours as needed for muscle  spasms. 08/27/21   Zenia Resides, MD    Family History History reviewed. No pertinent family history.  Social History Social History   Tobacco Use   Smoking status: Every Day    Packs/day: 0.50    Types: Cigarettes   Smokeless tobacco: Never  Vaping Use   Vaping Use: Never used  Substance Use Topics   Alcohol use: Yes    Comment: 6 or more beers a day    Drug use: Yes    Types: Marijuana     Allergies   Patient has no known allergies.   Review of Systems Review of Systems   Physical Exam Triage Vital Signs ED Triage Vitals  Enc Vitals Group     BP 08/30/21 0817 (!) 139/94     Pulse Rate 08/30/21 0817 92     Resp 08/30/21 0817 18     Temp 08/30/21 0817 98.4 F (36.9 C)     Temp Source 08/30/21 0817 Oral     SpO2 08/30/21 0817 99 %     Weight --      Height --      Head Circumference --      Peak Flow --      Pain Score 08/30/21 0818 2     Pain Loc --  Pain Edu? --      Excl. in GC? --    No data found.  Updated Vital Signs BP (!) 139/94 (BP Location: Left Arm)    Pulse 92    Temp 98.4 F (36.9 C) (Oral)    Resp 18    SpO2 99%   Visual Acuity Right Eye Distance:   Left Eye Distance:   Bilateral Distance:    Right Eye Near:   Left Eye Near:    Bilateral Near:     Physical Exam Vitals reviewed.  Eyes:     Extraocular Movements: Extraocular movements intact.     Pupils: Pupils are equal, round, and reactive to light.  Cardiovascular:     Rate and Rhythm: Normal rate and regular rhythm.     Heart sounds: No murmur heard. Pulmonary:     Effort: Pulmonary effort is normal.     Breath sounds: Normal breath sounds.  Musculoskeletal:     Cervical back: Neck supple.     Comments: Left trapezius/shoulder is less tender than it was. No deformity. ROM limited still some around left shoulder due to pain on abduction.  Skin:    Capillary Refill: Capillary refill takes less than 2 seconds.     Coloration: Skin is not pale.  Neurological:      General: No focal deficit present.     Mental Status: He is alert and oriented to person, place, and time.  Psychiatric:        Behavior: Behavior normal.     UC Treatments / Results  Labs (all labs ordered are listed, but only abnormal results are displayed) Labs Reviewed - No data to display  EKG   Radiology No results found.  Procedures Procedures (including critical care time)  Medications Ordered in UC Medications - No data to display  Initial Impression / Assessment and Plan / UC Course  I have reviewed the triage vital signs and the nursing notes.  Pertinent labs & imaging results that were available during my care of the patient were reviewed by me and considered in my medical decision making (see chart for details).     I have shown him the goodrx price for the tizanidine, so rx resent. Continue aspirin or ibuprofen otc, and rest/heat to the area. Final Clinical Impressions(s) / UC Diagnoses   Final diagnoses:  None   Discharge Instructions   None    ED Prescriptions   None    PDMP not reviewed this encounter.   Zenia Resides, MD 08/30/21 [phone]

## 2021-08-30 NOTE — ED Triage Notes (Signed)
Pt c/o lt shoulder pain while at work yesterday. Denies injury. States does heavy lifting at work.

## 2021-08-30 NOTE — Discharge Instructions (Addendum)
Continue to rest and use heat to the sore area.  Continue the bayer or ibuprofen as needed for pain. The tizanidine is a muscle relaxer that might also help.

## 2021-12-24 ENCOUNTER — Other Ambulatory Visit: Payer: Self-pay

## 2021-12-24 ENCOUNTER — Ambulatory Visit (INDEPENDENT_AMBULATORY_CARE_PROVIDER_SITE_OTHER): Payer: Managed Care, Other (non HMO)

## 2021-12-24 ENCOUNTER — Ambulatory Visit (HOSPITAL_COMMUNITY)
Admission: EM | Admit: 2021-12-24 | Discharge: 2021-12-24 | Disposition: A | Discharge status: Home / Self Care | Payer: Managed Care, Other (non HMO) | Attending: Emergency Medicine | Admitting: Emergency Medicine

## 2021-12-24 ENCOUNTER — Encounter (HOSPITAL_COMMUNITY): Payer: Self-pay | Admitting: Emergency Medicine

## 2021-12-24 DIAGNOSIS — R059 Cough, unspecified: Secondary | ICD-10-CM

## 2021-12-24 DIAGNOSIS — R079 Chest pain, unspecified: Secondary | ICD-10-CM

## 2021-12-24 MED ORDER — METHOCARBAMOL 500 MG PO TABS: 500.0000 mg | ORAL_TABLET | Freq: Two times a day (BID) | ORAL | 0 refills | Status: DC

## 2021-12-24 MED ORDER — MELOXICAM 15 MG PO TABS: 15.0000 mg | ORAL_TABLET | Freq: Every day | ORAL | 0 refills | Status: AC

## 2021-12-24 NOTE — ED Triage Notes (Addendum)
"  Tension" in chest as he touches left chest.  States if he coughs, it hurts.  Patient reports he does heavy lifting at work , particularly yesterday.  Today woke with pain in left chest .  Reports "he feels it", but if he coughs, he feels like he is "getting electrocuted" and then that pain goes away.  Also noticed when blowing nose ? ?Denies sob.  No diaphoresis.  No nausea, no vomiting ?

## 2021-12-24 NOTE — Discharge Instructions (Addendum)
Take meloxicam and Robaxin as directed. 

## 2021-12-24 NOTE — ED Provider Notes (Signed)
? ? ?Provider Note ? ?Patient Contact: 9:00 AM (approximate) ? ? ?History  ? ?No chief complaint on file. ? ? ?HPI ? ?Donald Tate. is a 36 y.o. male with a history of psoriasis, presents to the urgent care with left-sided chest discomfort that is occurred for the past 2 to 3 days.  Patient states he has had occasional cough but denies fever or chills.  No shortness of breath.  Denies history of DVT or PE.  No recent travel, prolonged immobilization or recent surgery.  Patient states that he does smoke cigarettes daily.  Denies a history of cardiac issues in the past.  No other alleviating measures have been attempted. ?  ? ? ?Physical Exam  ? ?Triage Vital Signs: ?ED Triage Vitals  ?Enc Vitals Group  ?   BP 12/24/21 0827 114/75  ?   Pulse Rate 12/24/21 0827 85  ?   Resp 12/24/21 0827 20  ?   Temp 12/24/21 0827 98.1 ?F (36.7 ?C)  ?   Temp Source 12/24/21 0827 Oral  ?   SpO2 12/24/21 0827 96 %  ?   Weight --   ?   Height --   ?   Head Circumference --   ?   Peak Flow --   ?   Pain Score 12/24/21 0823 2  ?   Pain Loc --   ?   Pain Edu? --   ?   Excl. in GC? --   ? ? ?Most recent vital signs: ?Vitals:  ? 12/24/21 0827  ?BP: 114/75  ?Pulse: 85  ?Resp: 20  ?Temp: 98.1 ?F (36.7 ?C)  ?SpO2: 96%  ? ? ? ?General: Alert and in no acute distress. ?Eyes:  PERRL. EOMI. ?Head: No acute traumatic findings ?ENT: ?     Ears:  ?     Nose: No congestion/rhinnorhea. ?     Mouth/Throat: Mucous membranes are moist.  ?Neck: No stridor. No cervical spine tenderness to palpation. ?Cardiovascular:  Good peripheral perfusion ?Respiratory: Normal respiratory effort without tachypnea or retractions. Lungs CTAB. Good air entry to the bases with no decreased or absent breath sounds. ?Gastrointestinal: Bowel sounds ?4 quadrants. Soft and nontender to palpation. No guarding or rigidity. No palpable masses. No distention. No CVA tenderness. ?Musculoskeletal: Full range of motion to all extremities.  ?Neurologic:  No gross focal neurologic  deficits are appreciated.  ?Skin:   No rash noted ?Other: ? ? ?ED Results / Procedures / Treatments  ? ?Labs ?(all labs ordered are listed, but only abnormal results are displayed) ?Labs Reviewed - No data to display ? ? ?EKG ? ?Normal sinus rhythm without ST segment elevation or other apparent arrhythmia. ? ?RADIOLOGY ? ?I personally viewed and evaluated these images as part of my medical decision making, as well as reviewing the written report by the radiologist. ? ?ED Provider Interpretation:  ? ? ?PROCEDURES: ? ?Critical Care performed: No ? ?Procedures ? ? ?MEDICATIONS ORDERED IN ED: ?Medications - No data to display ? ? ?IMPRESSION / MDM / ASSESSMENT AND PLAN / ED COURSE  ?I reviewed the triage vital signs and the nursing notes. ?             ?               ? ?Differential diagnosis includes, but is not limited to, pneumothorax, pneumonia, costochondritis, chest wall strain..... ? ?36 year old male presents to the urgent care with left-sided chest discomfort. ? ?Vital signs are reassuring at triage.  On physical exam, patient was alert, active and nontoxic-appearing.  EKG indicated normal sinus rhythm without ST segment elevation or other apparent arrhythmia. ? ?Patient has PERC score of 0, low suspicion for PE.  Will obtain 2 view chest x-ray to rule out pneumothorax and pneumonia and can better assess for rib fractures.  Will reassess. ? ?Chest x-ray shows no evidence of rib fracture, pneumothorax, consolidations or opacities. ? ?We will treat with meloxicam and Robaxin for likely chest wall strain.  Return precautions were given to return with new or worsening symptoms. ?  ? ? ?FINAL CLINICAL IMPRESSION(S) / ED DIAGNOSES  ? ?Final diagnoses:  ?Chest pain, unspecified type  ? ? ? ?Rx / DC Orders  ? ?ED Discharge Orders   ? ?      Ordered  ?  meloxicam (MOBIC) 15 MG tablet  Daily       ? 12/24/21 0934  ?  methocarbamol (ROBAXIN) 500 MG tablet  2 times daily       ? 12/24/21 0934  ? ?  ?  ? ?  ? ? ? ?Note:   This document was prepared using Dragon voice recognition software and may include unintentional dictation errors. ?  ?Orvil Feil, PA-C ?12/24/21 6967 ? ?

## 2022-01-10 IMAGING — CR DG SHOULDER 2+V*L*
3 series · 3 of 3 positions shown · non-contrast
Comparison: None.

CLINICAL DATA: Felt pop, burning pain in shoulder

EXAM:
LEFT SHOULDER - 2+ VIEW

[shoulder grashey]
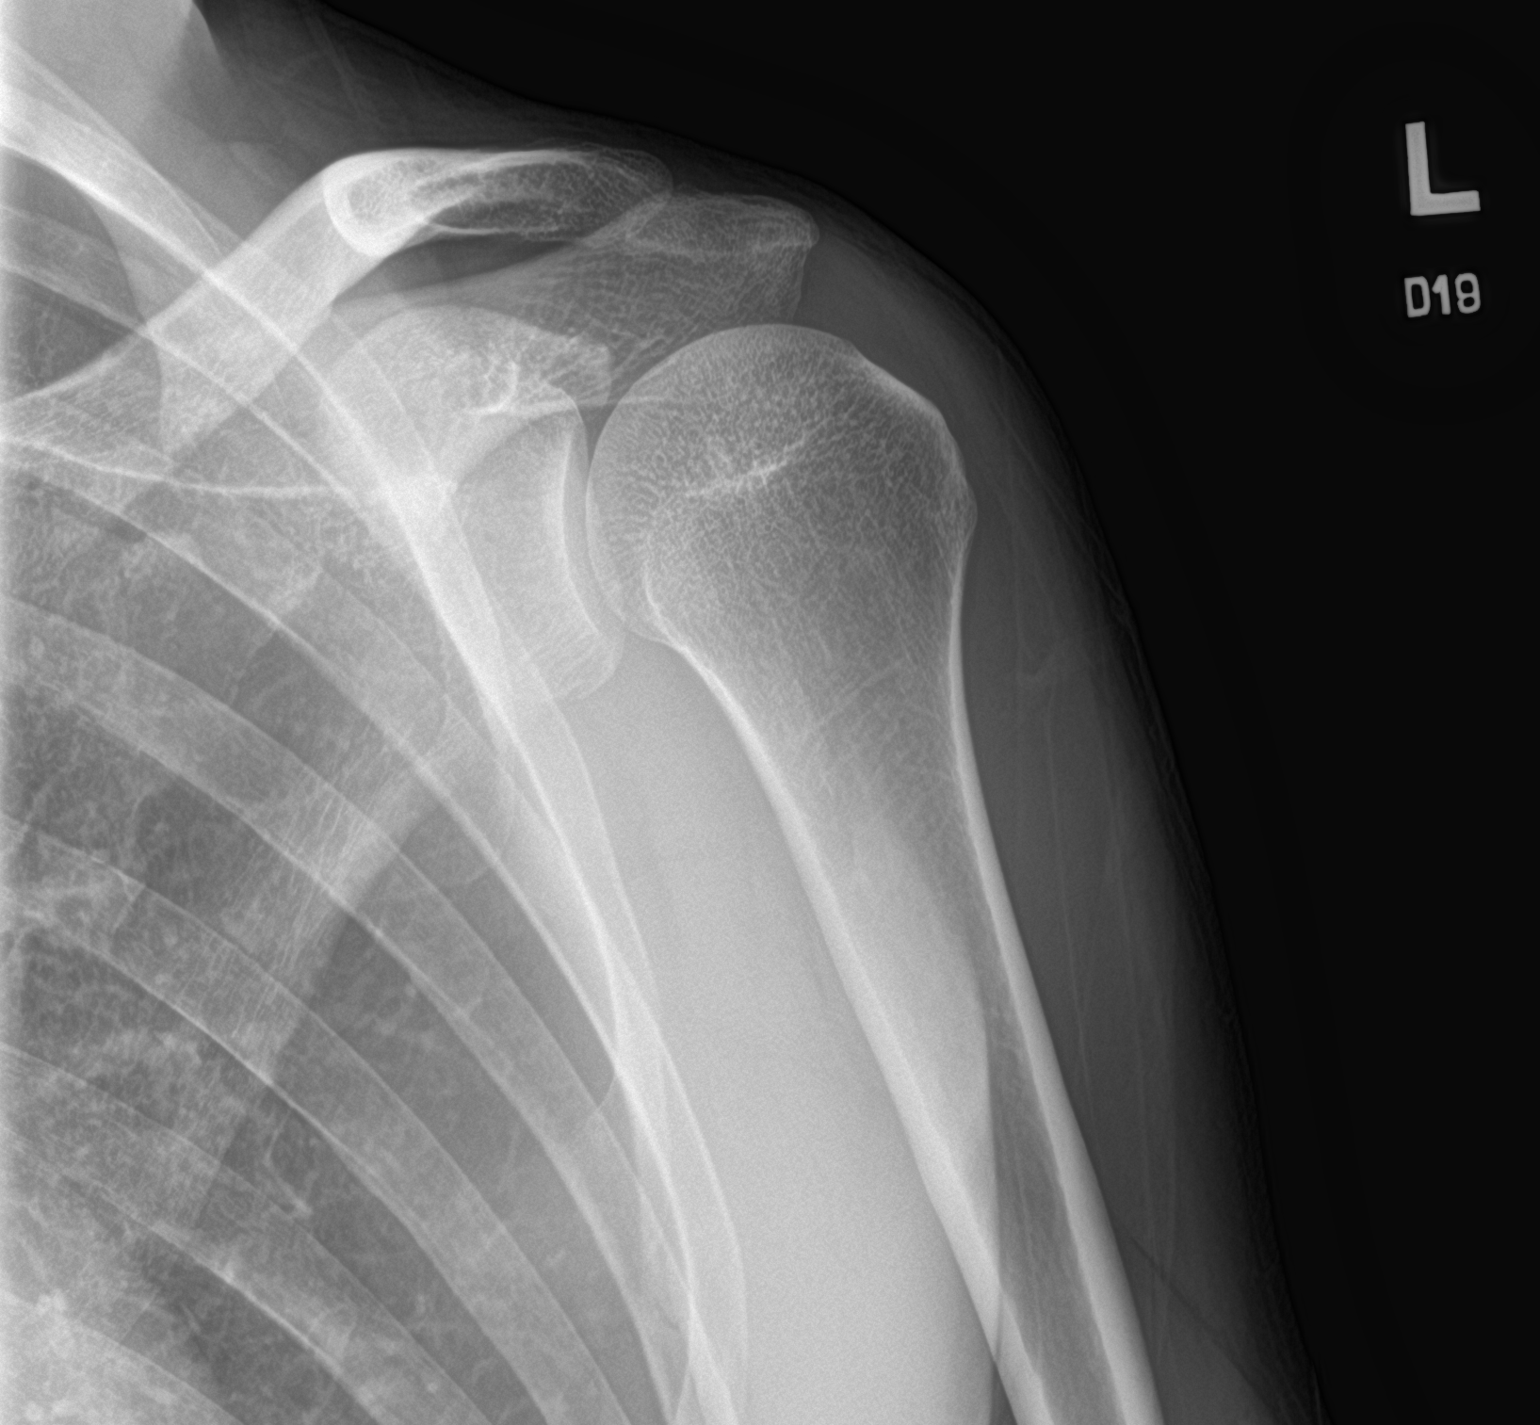

[shoulder y view]
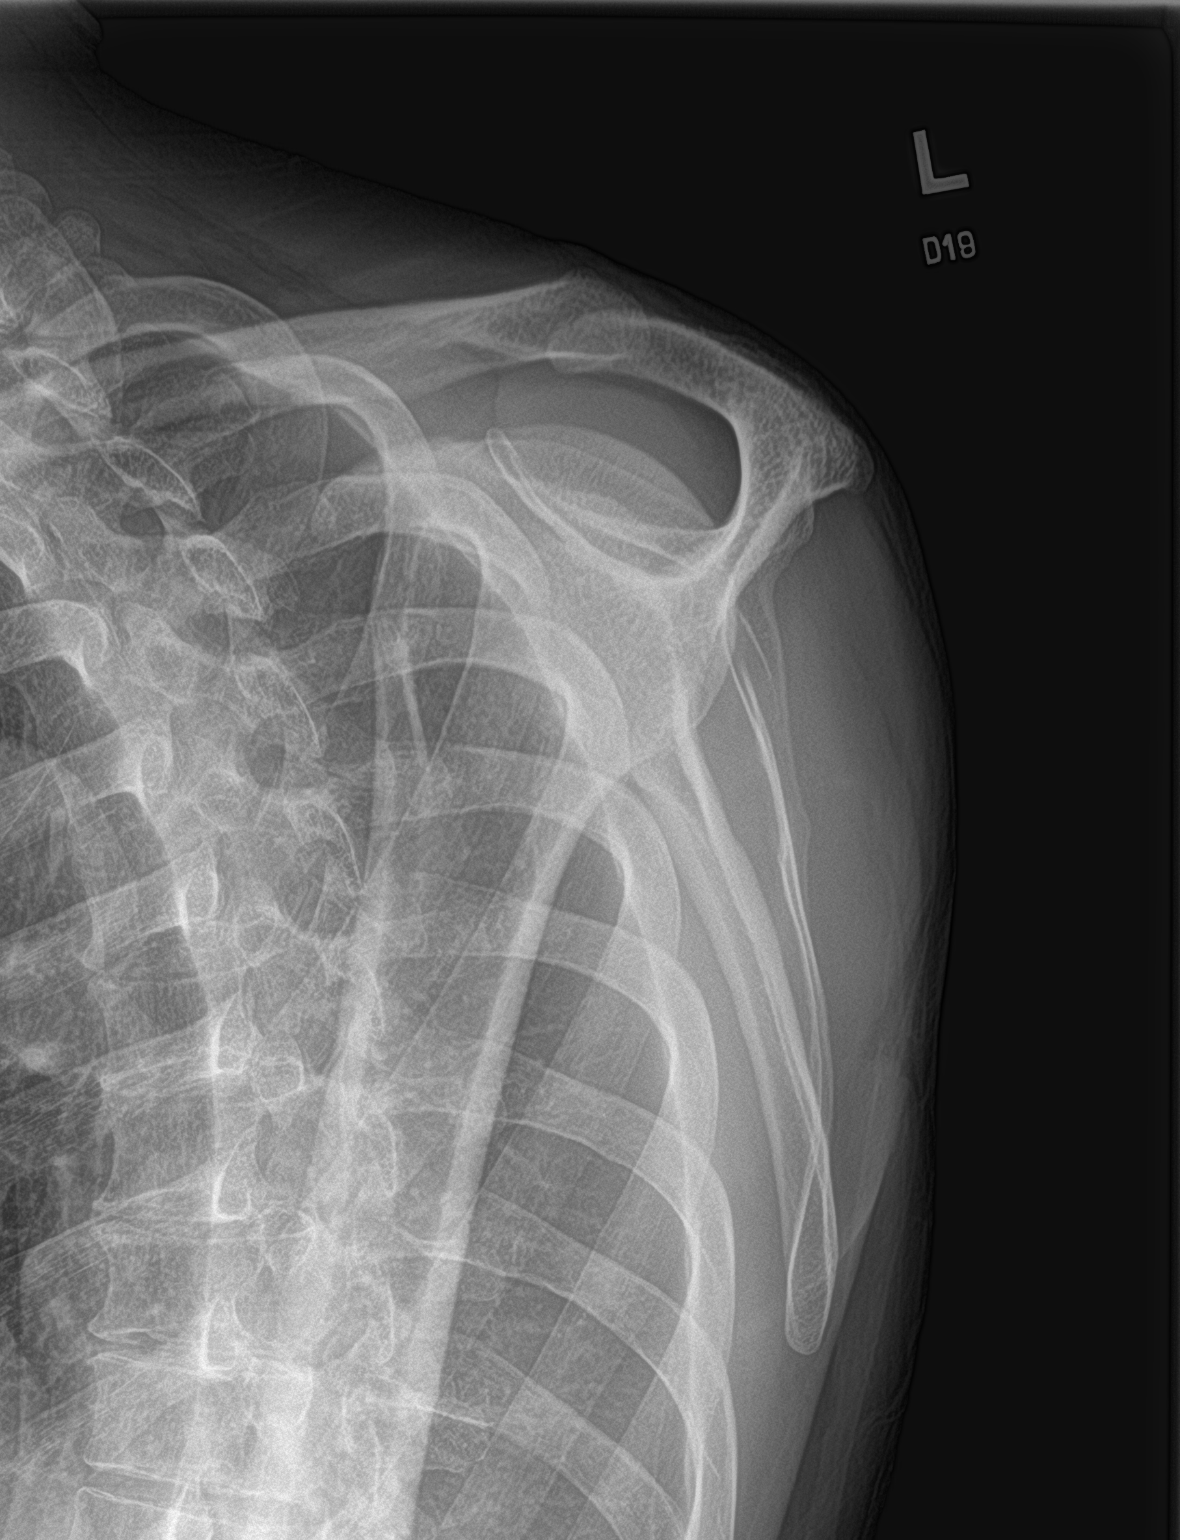

[shoulder axillary]
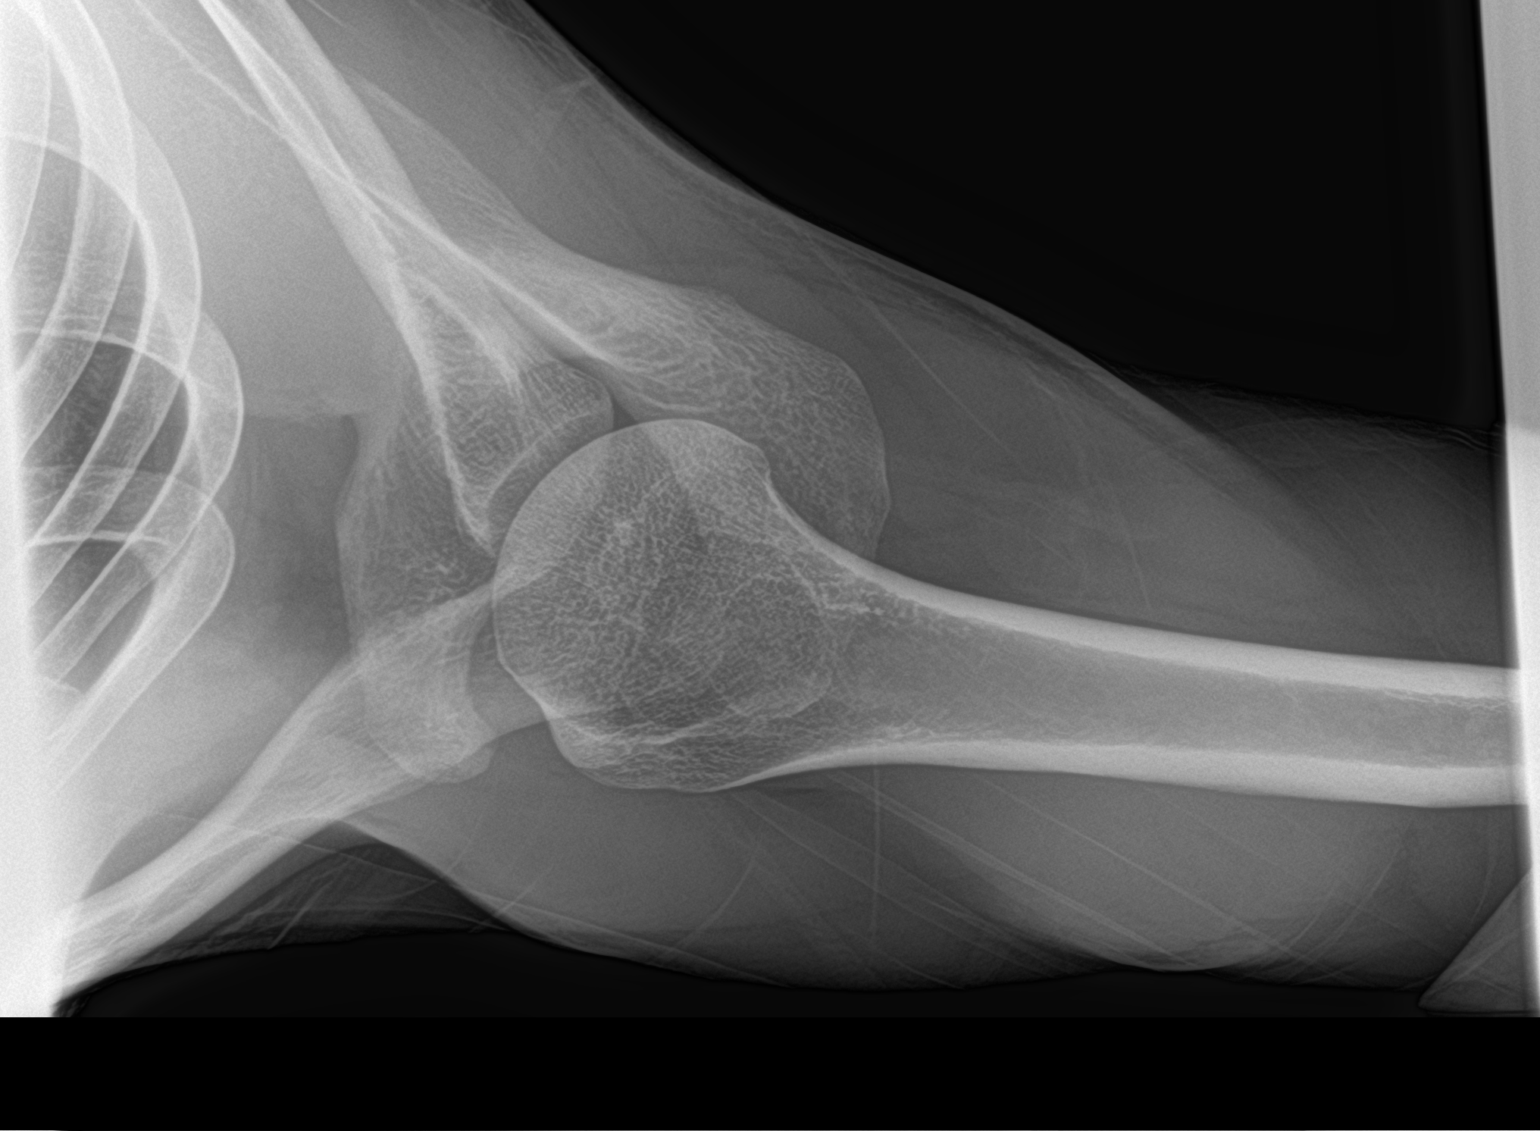

[3 of 3 positions shown; findings below may reference images not displayed]

FINDINGS: There is no evidence of fracture or dislocation. There is no
evidence of arthropathy or other focal bone abnormality. Soft
tissues are unremarkable.
IMPRESSION: No fracture or dislocation of the left shoulder. Joint spaces are
preserved.

## 2022-07-24 ENCOUNTER — Encounter (HOSPITAL_COMMUNITY): Payer: Self-pay

## 2022-07-24 ENCOUNTER — Ambulatory Visit (HOSPITAL_COMMUNITY)
Admission: EM | Admit: 2022-07-24 | Discharge: 2022-07-24 | Disposition: A | Discharge status: Home / Self Care | Payer: Managed Care, Other (non HMO) | Attending: Internal Medicine | Admitting: Internal Medicine

## 2022-07-24 DIAGNOSIS — Z1152 Encounter for screening for COVID-19: Secondary | ICD-10-CM | POA: Insufficient documentation

## 2022-07-24 DIAGNOSIS — Z79899 Other long term (current) drug therapy: Secondary | ICD-10-CM | POA: Insufficient documentation

## 2022-07-24 DIAGNOSIS — B349 Viral infection, unspecified: Secondary | ICD-10-CM

## 2022-07-24 LAB — RESP PANEL BY RT-PCR (FLU A&B, COVID) ARPGX2
Influenza A by PCR: NEGATIVE
Influenza B by PCR: NEGATIVE
SARS Coronavirus 2 by RT PCR: NEGATIVE

## 2022-07-24 MED ORDER — IBUPROFEN 600 MG PO TABS: 600.0000 mg | ORAL_TABLET | Freq: Four times a day (QID) | ORAL | 0 refills | Status: DC | PRN

## 2022-07-24 MED ORDER — ONDANSETRON 4 MG PO TBDP: 4.0000 mg | ORAL_TABLET | Freq: Three times a day (TID) | ORAL | 0 refills | Status: DC | PRN

## 2022-07-24 NOTE — Discharge Instructions (Signed)
Please increase oral fluid intake Take medications as prescribed We will call you with recommendations if labs are abnormal Return to urgent care if symptoms worsen

## 2022-07-24 NOTE — ED Notes (Signed)
Discharged by Dee, CMA.  

## 2022-07-24 NOTE — ED Triage Notes (Signed)
body aches, nausea, diarrhea, weak onset 2 days ago. Started with weakness and fatigues. Onset this morning with diarrhea and vomiting.   No one around the to with similar symptoms. 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 No new foods or meds.

## 2022-07-25 NOTE — ED Provider Notes (Signed)
[facility]    CSN: [id number] Arrival date & time: 07/24/22  3154      History   Chief Complaint Chief Complaint  Patient presents with   Emesis   Diarrhea   Generalized Body Aches    HPI Donald Tate. is a 36 y.o. male comes to the urgent care with a 2-day history of general malaise, fatigue with generalized body aches.  This morning patient started having some nausea and nonbloody/nonbilious vomiting.  Patient has had bouts of loose bowel movement.  He denies any changes in his diet.  No recent travel.  No sore throat, fever or chills.  Patient denies sick contacts.  He has had COVID in the remote past..   HPI  History reviewed. No pertinent past medical history.  There are no problems to display for this patient.   Past Surgical History:  Procedure Laterality Date   HERNIA REPAIR         Home Medications    Prior to Admission medications   Medication Sig Start Date End Date Taking? Authorizing Provider  ibuprofen (ADVIL) 600 MG tablet Take 1 tablet (600 mg total) by mouth every 6 (six) hours as needed. 07/24/22  Yes Oluwadamilola Rosamond, Britta Mccreedy, MD  ondansetron (ZOFRAN-ODT) 4 MG disintegrating tablet Take 1 tablet (4 mg total) by mouth every 8 (eight) hours as needed for nausea or vomiting. 07/24/22  Yes Giorgi Debruin, Britta Mccreedy, MD  albuterol (VENTOLIN HFA) 108 (90 Base) MCG/ACT inhaler Inhale 1-2 puffs into the lungs every 6 (six) hours as needed for wheezing or shortness of breath. 05/29/21   Coralyn Mark, NP  levocetirizine (XYZAL) 5 MG tablet Take 1 tablet (5 mg total) by mouth every evening. 05/29/21   Coralyn Mark, NP  methocarbamol (ROBAXIN) 500 MG tablet Take 1 tablet (500 mg total) by mouth 2 (two) times daily. 12/24/21   Orvil Feil, PA-C  predniSONE (STERAPRED UNI-PAK 21 TAB) 10 MG (21) TBPK tablet Take by mouth daily. Take 6 tabs by mouth daily  for 2 days, then 5 tabs for 2 days, then 4 tabs for 2 days, then 3 tabs for 2 days, 2 tabs for 2  days, then 1 tab by mouth daily for 2 days Patient not taking: Reported on 12/24/2021 05/29/21   Coralyn Mark, NP  salicylic acid 17 % gel Apply topically daily. Patient not taking: Reported on 12/24/2021 07/26/21   Raspet, Denny Peon K, PA-C  tiZANidine (ZANAFLEX) 4 MG tablet Take 1 tablet (4 mg total) by mouth every 8 (eight) hours as needed for muscle spasms. Patient not taking: Reported on 12/24/2021 08/30/21   Zenia Resides, MD    Family History Family History  Problem Relation Age of Onset   Healthy Mother     Social History Social History   Tobacco Use   Smoking status: Every Day    Packs/day: 0.50    Types: Cigarettes   Smokeless tobacco: Never  Vaping Use   Vaping Use: Never used  Substance Use Topics   Alcohol use: Yes    Comment: 6 or more beers a day    Drug use: Yes    Types: Marijuana     Allergies   Patient has no known allergies.   Review of Systems Review of Systems  Constitutional:  Positive for fatigue. Negative for appetite change, chills and fever.  HENT:  Positive for congestion. Negative for rhinorrhea and sore throat.   Respiratory: Negative.    Gastrointestinal:  Positive for diarrhea, nausea and vomiting.  Genitourinary: Negative.   Musculoskeletal:  Positive for myalgias.     Physical Exam Triage Vital Signs ED Triage Vitals  Enc Vitals Group     BP 07/24/22 0829 117/83     Pulse Rate 07/24/22 0829 70     Resp 07/24/22 0829 16     Temp 07/24/22 0829 98.5 F (36.9 C)     Temp Source 07/24/22 0829 Oral     SpO2 07/24/22 0829 98 %     Weight --      Height --      Head Circumference --      Peak Flow --      Pain Score 07/24/22 0828 3     Pain Loc --      Pain Edu? --      Excl. in GC? --    No data found.  Updated Vital Signs BP 117/83 (BP Location: Left Arm)   Pulse 70   Temp 98.5 F (36.9 C) (Oral)   Resp 16   SpO2 98%   Visual Acuity Right Eye Distance:   Left Eye Distance:   Bilateral Distance:    Right  Eye Near:   Left Eye Near:    Bilateral Near:     Physical Exam Vitals and nursing note reviewed.  Constitutional:      General: He is not in acute distress.    Appearance: He is not ill-appearing.  HENT:     Right Ear: Tympanic membrane normal.     Left Ear: Tympanic membrane normal.     Mouth/Throat:     Mouth: Mucous membranes are moist.  Cardiovascular:     Rate and Rhythm: Normal rate and regular rhythm.     Pulses: Normal pulses.     Heart sounds: Normal heart sounds.  Pulmonary:     Effort: Pulmonary effort is normal.     Breath sounds: Normal breath sounds.  Neurological:     Mental Status: He is alert.     UC Treatments / Results  Labs (all labs ordered are listed, but only abnormal results are displayed) Labs Reviewed  RESP PANEL BY RT-PCR (FLU A&B, COVID) [id number]    EKG   Radiology No results found.  Procedures Procedures (including critical care time)  Medications Ordered in UC Medications - No data to display  Initial Impression / Assessment and Plan / UC Course  I have reviewed the triage vital signs and the nursing notes.  Pertinent labs & imaging results that were available during my care of the patient were reviewed by me and considered in my medical decision making (see chart for details).     1.  Acute viral illness: Respiratory PCR for COVID, flu A/B. Zofran as needed for nausea/vomiting Ibuprofen as needed for pain Maintain adequate hydration Return precautions given Final Clinical Impressions(s) / UC Diagnoses   Final diagnoses:  Viral illness     Discharge Instructions      Please increase oral fluid intake Take medications as prescribed We will call you with recommendations if labs are abnormal Return to urgent care if symptoms worsen   ED Prescriptions     Medication Sig Dispense Auth. Provider   ondansetron (ZOFRAN-ODT) 4 MG disintegrating tablet Take 1 tablet (4 mg total) by mouth every 8 (eight) hours as needed  for nausea or vomiting. 20 tablet Lakesha Levinson, Britta Mccreedy, MD   ibuprofen (ADVIL) 600 MG tablet Take 1 tablet (600 mg total) by mouth  every 6 (six) hours as needed. 30 tablet Liya Strollo, Britta Mccreedy, MD      PDMP not reviewed this encounter.   Merrilee Jansky, MD 07/25/22 2206

## 2023-06-14 ENCOUNTER — Ambulatory Visit (INDEPENDENT_AMBULATORY_CARE_PROVIDER_SITE_OTHER): Payer: Managed Care, Other (non HMO)

## 2023-06-14 ENCOUNTER — Encounter (HOSPITAL_COMMUNITY): Payer: Self-pay | Admitting: Emergency Medicine

## 2023-06-14 ENCOUNTER — Ambulatory Visit (HOSPITAL_COMMUNITY)
Admission: EM | Admit: 2023-06-14 | Discharge: 2023-06-14 | Disposition: A | Discharge status: Home / Self Care | Payer: Managed Care, Other (non HMO) | Attending: Family Medicine | Admitting: Family Medicine

## 2023-06-14 DIAGNOSIS — M25512 Pain in left shoulder: Secondary | ICD-10-CM | POA: Diagnosis not present

## 2023-06-14 MED ORDER — IBUPROFEN 800 MG PO TABS: 800.0000 mg | ORAL_TABLET | Freq: Three times a day (TID) | ORAL | 0 refills | Status: AC | PRN

## 2023-06-14 NOTE — ED Triage Notes (Signed)
Pt fell yesterday his dog got under his feet and tripped him. He he hit his left shoulder on wooden chair and it has been causing him pain since.

## 2023-06-14 NOTE — Discharge Instructions (Signed)
The x-ray by my review does not show any broken bones.  The radiologist will also read your x-ray, and if their interpretation differs significantly from mine, we will call you.   Take ibuprofen 800 mg--1 tab every 8 hours as needed for pain.  Ice can also help how it feels.

## 2023-06-14 NOTE — ED Provider Notes (Signed)
[facility]    CSN: [id number] Arrival date & time: 06/14/23  1001      History   Chief Complaint Chief Complaint  Patient presents with   Shoulder Pain    HPI Donald Tate. is a 37 y.o. male.    Shoulder Pain Here for left shoulder pain.  Yesterday evening he was coming in the door with groceries when his dog ran out and tripped him.  He fell onto a wooden chair hitting his left shoulder.  It is mainly burning in the posterior part of his shoulder.  Yesterday evening his arm felt numb but that is better.  He did fall onto his left elbow, however it is just sore now.  There is an abrasion there.  He states his last tetanus was in the last year.  NKDA  History reviewed. No pertinent past medical history.  There are no problems to display for this patient.   Past Surgical History:  Procedure Laterality Date   HERNIA REPAIR         Home Medications    Prior to Admission medications   Medication Sig Start Date End Date Taking? Authorizing Provider  ibuprofen (ADVIL) 800 MG tablet Take 1 tablet (800 mg total) by mouth every 8 (eight) hours as needed (pain). 06/14/23  Yes Zenia Resides, MD    Family History Family History  Problem Relation Age of Onset   Healthy Mother     Social History Social History   Tobacco Use   Smoking status: Every Day    Current packs/day: 0.50    Types: Cigarettes   Smokeless tobacco: Never  Vaping Use   Vaping status: Never Used  Substance Use Topics   Alcohol use: Yes    Comment: 6 or more beers a day    Drug use: Yes    Types: Marijuana     Allergies   Patient has no known allergies.   Review of Systems Review of Systems   Physical Exam Triage Vital Signs ED Triage Vitals  Encounter Vitals Group     BP 06/14/23 1014 (!) 136/95     Systolic BP Percentile --      Diastolic BP Percentile --      Pulse Rate 06/14/23 1014 71     Resp 06/14/23 1014 16     Temp 06/14/23 1014 98.8 F (37.1  C)     Temp Source 06/14/23 1014 Oral     SpO2 06/14/23 1014 95 %     Weight --      Height --      Head Circumference --      Peak Flow --      Pain Score 06/14/23 1011 3     Pain Loc --      Pain Education --      Exclude from Growth Chart --    No data found.  Updated Vital Signs BP (!) 136/95 (BP Location: Right Arm)   Pulse 71   Temp 98.8 F (37.1 C) (Oral)   Resp 16   SpO2 95%   Visual Acuity Right Eye Distance:   Left Eye Distance:   Bilateral Distance:    Right Eye Near:   Left Eye Near:    Bilateral Near:     Physical Exam Vitals reviewed.  Constitutional:      General: He is not in acute distress.    Appearance: He is not ill-appearing, toxic-appearing or diaphoretic.  Musculoskeletal:  Comments: There is some tenderness of the left posterior shoulder.  Range of motion is limited by pain.  Skin:    Coloration: Skin is not jaundiced or pale.     Comments: There is an abrasion that is shallow on the left elbow overlying the olecranon.  It is about 1 cm x 3 cm.  It is not bleeding.  Neurological:     General: No focal deficit present.     Mental Status: He is alert and oriented to person, place, and time.  Psychiatric:        Behavior: Behavior normal.      UC Treatments / Results  Labs (all labs ordered are listed, but only abnormal results are displayed) Labs Reviewed - No data to display  EKG   Radiology No results found.  Procedures Procedures (including critical care time)  Medications Ordered in UC Medications - No data to display  Initial Impression / Assessment and Plan / UC Course  I have reviewed the triage vital signs and the nursing notes.  Pertinent labs & imaging results that were available during my care of the patient were reviewed by me and considered in my medical decision making (see chart for details).       X-ray by my review does not show any fractures.  He is advised of radiology over read.  Ibuprofen is  sent in for further pain relief, and work note is supplied.  Final Clinical Impressions(s) / UC Diagnoses   Final diagnoses:  Acute pain of left shoulder     Discharge Instructions      The x-ray by my review does not show any broken bones.  The radiologist will also read your x-ray, and if their interpretation differs significantly from mine, we will call you.   Take ibuprofen 800 mg--1 tab every 8 hours as needed for pain.  Ice can also help how it feels.       ED Prescriptions     Medication Sig Dispense Auth. Provider   ibuprofen (ADVIL) 800 MG tablet Take 1 tablet (800 mg total) by mouth every 8 (eight) hours as needed (pain). 21 tablet Nickoles Gregori, Janace Aris, MD      I have reviewed the PDMP during this encounter.   Zenia Resides, MD 06/14/23 1053

## 2023-06-23 ENCOUNTER — Ambulatory Visit (INDEPENDENT_AMBULATORY_CARE_PROVIDER_SITE_OTHER): Payer: Managed Care, Other (non HMO)

## 2023-06-23 ENCOUNTER — Other Ambulatory Visit (HOSPITAL_COMMUNITY): Payer: Managed Care, Other (non HMO)

## 2023-06-23 ENCOUNTER — Encounter (HOSPITAL_COMMUNITY): Payer: Self-pay | Admitting: Emergency Medicine

## 2023-06-23 ENCOUNTER — Ambulatory Visit (HOSPITAL_COMMUNITY)
Admission: EM | Admit: 2023-06-23 | Discharge: 2023-06-23 | Disposition: A | Discharge status: Home / Self Care | Payer: Managed Care, Other (non HMO) | Attending: Internal Medicine | Admitting: Internal Medicine

## 2023-06-23 DIAGNOSIS — M545 Low back pain, unspecified: Secondary | ICD-10-CM | POA: Diagnosis not present

## 2023-06-23 MED ORDER — CYCLOBENZAPRINE HCL 5 MG PO TABS: 5.0000 mg | ORAL_TABLET | Freq: Three times a day (TID) | ORAL | 0 refills | Status: AC | PRN

## 2023-06-23 NOTE — ED Triage Notes (Addendum)
Pt c/o of lower back pain that has incresed since his fall on 06/13/23. States his shoulder is doing better since his visit on 10/6 but the back pain has gotten progressively worse and feels like pinching at this spine.

## 2023-06-23 NOTE — ED Provider Notes (Signed)
[facility]    CSN: [id number] Arrival date & time: 06/23/23  0801      History   Chief Complaint Chief Complaint  Patient presents with   Back Pain    HPI Donald Tate. is a 37 y.o. male.    Back Pain Associated symptoms: no fever and no weakness   Low back pain onset after a fall 10 days ago.  States he was carrying groceries up outside stairs when he tripped over a dog causing him to fall on his left side hitting his shoulder elbow and hip.  He does not recall hitting his back however has had back pain since.  He was seen here 06/14/2023 for shoulder pain, states his shoulder pain has improved but his back pain has persisted.  Pain is lower lumbar center and left side, constant, ache, sharp with any movement particularly with bending or transitioning from sitting to standing.  Has had similar back pain in the past, never treated by a medical provider.  Denies history of prior back surgeries or history of herniated disks.  Denies abdominal pain, nausea, vomiting, urinary symptoms, lower extremity weakness, radiation of pain to extremities.  He has been taking ibuprofen with some relief.  He works at [employer], his job involves a lot of lifting.  History reviewed. No pertinent past medical history.  There are no problems to display for this patient.   Past Surgical History:  Procedure Laterality Date   HERNIA REPAIR         Home Medications    Prior to Admission medications   Medication Sig Start Date End Date Taking? Authorizing Provider  cyclobenzaprine (FLEXERIL) 5 MG tablet Take 1 tablet (5 mg total) by mouth 3 (three) times daily as needed for up to 5 days for muscle spasms. 06/23/23 06/28/23 Yes Meliton Rattan, PA  ibuprofen (ADVIL) 800 MG tablet Take 1 tablet (800 mg total) by mouth every 8 (eight) hours as needed (pain). 06/14/23   Zenia Resides, MD    Family History Family History  Problem Relation Age of Onset   Healthy Mother      Social History Social History   Tobacco Use   Smoking status: Every Day    Current packs/day: 0.50    Types: Cigarettes   Smokeless tobacco: Never  Vaping Use   Vaping status: Never Used  Substance Use Topics   Alcohol use: Yes    Comment: 6 or more beers a day    Drug use: Yes    Types: Marijuana     Allergies   Patient has no known allergies.   Review of Systems Review of Systems  Constitutional:  Negative for fever.  Gastrointestinal:  Negative for diarrhea, nausea and vomiting.  Genitourinary:  Negative for flank pain, frequency, hematuria and urgency.  Musculoskeletal:  Positive for back pain.  Neurological:  Negative for weakness.     Physical Exam Triage Vital Signs ED Triage Vitals [06/23/23 0816]  Encounter Vitals Group     BP (!) 173/90     Systolic BP Percentile      Diastolic BP Percentile      Pulse Rate 72     Resp 16     Temp 98.2 F (36.8 C)     Temp Source Oral     SpO2 96 %     Weight      Height      Head Circumference      Peak Flow  Pain Score 7     Pain Loc      Pain Education      Exclude from Growth Chart    No data found.  Updated Vital Signs BP (!) 164/97 (BP Location: Right Arm)   Pulse 72   Temp 98.2 F (36.8 C) (Oral)   Resp 16   SpO2 96%   Visual Acuity Right Eye Distance:   Left Eye Distance:   Bilateral Distance:    Right Eye Near:   Left Eye Near:    Bilateral Near:     Physical Exam Vitals and nursing note reviewed.  HENT:     Head: Atraumatic.  Cardiovascular:     Rate and Rhythm: Normal rate and regular rhythm.  Pulmonary:     Effort: Pulmonary effort is normal.     Breath sounds: Normal breath sounds.  Abdominal:     Palpations: Abdomen is soft.     Tenderness: There is no abdominal tenderness.  Musculoskeletal:     Thoracic back: No bony tenderness.     Lumbar back: Tenderness present. Negative right straight leg raise test and negative left straight leg raise test.  Neurological:      Mental Status: He is oriented to person, place, and time.     Motor: No weakness.     Gait: Gait normal.     Deep Tendon Reflexes: Reflexes normal.  Psychiatric:        Mood and Affect: Mood normal.      UC Treatments / Results  Labs (all labs ordered are listed, but only abnormal results are displayed) Labs Reviewed - No data to display  EKG   Radiology No results found.  Procedures Procedures (including critical care time)  Medications Ordered in UC Medications - No data to display  Initial Impression / Assessment and Plan / UC Course  I have reviewed the triage vital signs and the nursing notes.  Pertinent labs & imaging results that were available during my care of the patient were reviewed by me and considered in my medical decision making (see chart for details).    Patient with low back pain for 10 days after fall did not strike his back.  Pain is persistent, has mild tenderness on exam, LS-spine independently viewed by me degenerative changes fracture Recommend follow-up with PCP for further evaluation of back pain and to recheck blood pressure  Final Clinical Impressions(s) / UC Diagnoses   Final diagnoses:  Acute left-sided low back pain without sciatica     Discharge Instructions      Over-the-counter ibuprofen 3 tablets every 6 hours with food as needed for pain The x-ray reading we discussed is preliminary. Your x-ray will be read by a radiologist in next few hours. If there is a discrepancy, you will be contacted, and instructed on a new plan for you care.   Follow-up with your primary care provider for further evaluation of your back pain and to check your blood pressure     ED Prescriptions     Medication Sig Dispense Auth. Provider   cyclobenzaprine (FLEXERIL) 5 MG tablet Take 1 tablet (5 mg total) by mouth 3 (three) times daily as needed for up to 5 days for muscle spasms. 15 tablet Meliton Rattan, [place]      PDMP not reviewed this  encounter.   Meliton Rattan, [place] 06/23/23 [phone]

## 2023-06-23 NOTE — Discharge Instructions (Addendum)
Over-the-counter ibuprofen 3 tablets every 6 hours with food as needed for pain The x-ray reading we discussed is preliminary. Your x-ray will be read by a radiologist in next few hours. If there is a discrepancy, you will be contacted, and instructed on a new plan for you care.   Follow-up with your primary care provider for further evaluation of your back pain and to check your blood pressure

## 2023-08-09 ENCOUNTER — Ambulatory Visit
Admission: EM | Admit: 2023-08-09 | Discharge: 2023-08-09 | Disposition: A | Discharge status: Home / Self Care | Payer: Managed Care, Other (non HMO) | Attending: Emergency Medicine | Admitting: Emergency Medicine

## 2023-08-09 DIAGNOSIS — K529 Noninfective gastroenteritis and colitis, unspecified: Secondary | ICD-10-CM

## 2023-08-09 MED ORDER — ONDANSETRON 4 MG PO TBDP: 4.0000 mg | ORAL_TABLET | Freq: Four times a day (QID) | ORAL | 0 refills | Status: AC | PRN

## 2023-08-09 MED ORDER — ONDANSETRON 4 MG PO TBDP
4.0000 mg | ORAL_TABLET | Freq: Once | ORAL | Status: AC
  Administered 2023-08-09: 4 mg via ORAL

## 2023-08-09 NOTE — Discharge Instructions (Addendum)
Tylenol can be used for fever/aches. Ibuprofen can be used only if you can eat something first.  The zofran can be used every 6 hours as needed to settle the stomach  Drink lots of fluids!  If you have appetite, stick with bland foods  Please return if needed

## 2023-08-09 NOTE — ED Provider Notes (Signed)
[facility]    CSN: [id number] Arrival date & time: 08/09/23  0806      History   Chief Complaint Chief Complaint  Patient presents with   Nausea    Nausea and fever x2 days    HPI Donald Tate. is a 37 y.o. male.  Yesterday developed low grade fever and nausea Tmax 100.8 Some looser stools. No vomiting Has tolerated fluids Taking ibuprofen, last dose 2 hours ago. Feeling better with that. Daughter was sick recently with similar   History reviewed. No pertinent past medical history.  There are no problems to display for this patient.   Past Surgical History:  Procedure Laterality Date   HERNIA REPAIR         Home Medications    Prior to Admission medications   Medication Sig Start Date End Date Taking? Authorizing Provider  ibuprofen (ADVIL) 800 MG tablet Take 1 tablet (800 mg total) by mouth every 8 (eight) hours as needed (pain). 06/14/23  Yes Zenia Resides, MD  ondansetron (ZOFRAN-ODT) 4 MG disintegrating tablet Take 1 tablet (4 mg total) by mouth every 6 (six) hours as needed for nausea or vomiting. 08/09/23  Yes Marc Sivertsen, Lurena Joiner, PA-C    Family History Family History  Problem Relation Age of Onset   Healthy Mother     Social History Social History   Tobacco Use   Smoking status: Every Day    Current packs/day: 0.50    Types: Cigarettes   Smokeless tobacco: Never  Vaping Use   Vaping status: Never Used  Substance Use Topics   Alcohol use: Yes    Comment: 6 or more beers a day    Drug use: Yes    Types: Marijuana     Allergies   Patient has no known allergies.   Review of Systems Review of Systems Per HPI  Physical Exam Triage Vital Signs ED Triage Vitals  Encounter Vitals Group     BP 08/09/23 0819 (!) 152/100     Systolic BP Percentile --      Diastolic BP Percentile --      Pulse Rate 08/09/23 0819 74     Resp 08/09/23 0819 17     Temp 08/09/23 0819 98.5 F (36.9 C)     Temp src --      SpO2 08/09/23  0819 96 %     Weight 08/09/23 0818 155 lb (70.3 kg)     Height 08/09/23 0818 5\' 11"  (1.803 m)     Head Circumference --      Peak Flow --      Pain Score 08/09/23 0818 0     Pain Loc --      Pain Education --      Exclude from Growth Chart --    No data found.  Updated Vital Signs BP (!) 162/92 (BP Location: Left Arm)   Pulse 82   Temp 98.5 F (36.9 C)   Resp 17   Ht 5\' 11"  (1.803 m)   Wt 155 lb (70.3 kg)   SpO2 96%   BMI 21.62 kg/m   Visual Acuity Right Eye Distance:   Left Eye Distance:   Bilateral Distance:    Right Eye Near:   Left Eye Near:    Bilateral Near:     Physical Exam Vitals and nursing note reviewed.  Constitutional:      Appearance: Normal appearance.  HENT:     Mouth/Throat:     Mouth: Mucous  membranes are moist.     Pharynx: Oropharynx is clear.  Eyes:     Conjunctiva/sclera: Conjunctivae normal.  Cardiovascular:     Rate and Rhythm: Normal rate and regular rhythm.     Heart sounds: Normal heart sounds.  Pulmonary:     Effort: Pulmonary effort is normal.     Breath sounds: Normal breath sounds.  Abdominal:     Palpations: Abdomen is soft.     Tenderness: There is no abdominal tenderness. There is no right CVA tenderness, left CVA tenderness, guarding or rebound.  Musculoskeletal:        General: Normal range of motion.     Cervical back: Normal range of motion.  Skin:    General: Skin is warm and dry.  Neurological:     Mental Status: He is alert and oriented to person, place, and time.      UC Treatments / Results  Labs (all labs ordered are listed, but only abnormal results are displayed) Labs Reviewed - No data to display  EKG   Radiology No results found.  Procedures Procedures (including critical care time)  Medications Ordered in UC Medications  ondansetron (ZOFRAN-ODT) disintegrating tablet 4 mg (has no administration in time range)    Initial Impression / Assessment and Plan / UC Course  I have reviewed the  triage vital signs and the nursing notes.  Pertinent labs & imaging results that were available during my care of the patient were reviewed by me and considered in my medical decision making (see chart for details).  Afebrile in clinic, well-appearing.  BP slightly elevated; patient reports she gets nervous in clinic setting.  No red flags.  Likely stomach virus.  Zofran dose given in clinic.  Sent to pharmacy to use every 6 hours as needed.  Advise increasing fluids, bland diet as tolerated.  Return if needed.  Patient is agreeable to plan.  Work note provided  Final Clinical Impressions(s) / UC Diagnoses   Final diagnoses:  Gastroenteritis     Discharge Instructions      Tylenol can be used for fever/aches. Ibuprofen can be used only if you can eat something first.  The zofran can be used every 6 hours as needed to settle the stomach  Drink lots of fluids!  If you have appetite, stick with bland foods  Please return if needed     ED Prescriptions     Medication Sig Dispense Auth. Provider   ondansetron (ZOFRAN-ODT) 4 MG disintegrating tablet Take 1 tablet (4 mg total) by mouth every 6 (six) hours as needed for nausea or vomiting. 20 tablet Dione Mccombie, Lurena Joiner, PA-C      PDMP not reviewed this encounter.   Marlow Baars, [place] 08/09/23 [phone]

## 2023-08-09 NOTE — ED Triage Notes (Signed)
Pt states that he has a fever and nausea. X2 days  Pt states that he had Ibuprofen at 6:00 am today.
